# Patient Record
Sex: Female | Born: 1965
Health system: Southern US, Community
[De-identification: ages and names within clinical notes are randomized; demographics above are authoritative.]

## PROBLEM LIST (undated history)

## (undated) DIAGNOSIS — F419 Anxiety disorder, unspecified: Secondary | ICD-10-CM

## (undated) DIAGNOSIS — C4491 Basal cell carcinoma of skin, unspecified: Secondary | ICD-10-CM

## (undated) DIAGNOSIS — M519 Unspecified thoracic, thoracolumbar and lumbosacral intervertebral disc disorder: Secondary | ICD-10-CM

## (undated) DIAGNOSIS — F329 Major depressive disorder, single episode, unspecified: Secondary | ICD-10-CM

## (undated) DIAGNOSIS — IMO0002 Reserved for concepts with insufficient information to code with codable children: Secondary | ICD-10-CM

## (undated) DIAGNOSIS — F32A Depression, unspecified: Secondary | ICD-10-CM

## (undated) HISTORY — PX: COLPOSCOPY: SHX161

## (undated) HISTORY — DX: Basal cell carcinoma of skin, unspecified: C44.91

## (undated) HISTORY — PX: OTHER SURGICAL HISTORY: SHX169

## (undated) HISTORY — DX: Unspecified thoracic, thoracolumbar and lumbosacral intervertebral disc disorder: M51.9

## (undated) HISTORY — DX: Reserved for concepts with insufficient information to code with codable children: IMO0002

---

## 2003-07-17 ENCOUNTER — Encounter: Admission: RE | Admit: 2003-07-17 | Discharge: 2003-07-17 | Payer: Self-pay | Admitting: Family Medicine

## 2003-10-15 ENCOUNTER — Other Ambulatory Visit: Admission: RE | Admit: 2003-10-15 | Discharge: 2003-10-15 | Payer: Self-pay | Admitting: Gynecology

## 2003-12-30 ENCOUNTER — Emergency Department (HOSPITAL_COMMUNITY): Admission: EM | Admit: 2003-12-30 | Discharge: 2003-12-30 | Payer: Self-pay | Admitting: Family Medicine

## 2004-04-14 ENCOUNTER — Ambulatory Visit (HOSPITAL_COMMUNITY): Admission: RE | Admit: 2004-04-14 | Discharge: 2004-04-14 | Payer: Self-pay | Admitting: Gynecology

## 2004-12-24 ENCOUNTER — Other Ambulatory Visit: Admission: RE | Admit: 2004-12-24 | Discharge: 2004-12-24 | Payer: Self-pay | Admitting: Gynecology

## 2005-12-05 ENCOUNTER — Encounter: Admission: RE | Admit: 2005-12-05 | Discharge: 2005-12-05 | Payer: Self-pay | Admitting: Gynecology

## 2006-01-16 ENCOUNTER — Other Ambulatory Visit: Admission: RE | Admit: 2006-01-16 | Discharge: 2006-01-16 | Payer: Self-pay | Admitting: Gynecology

## 2006-05-11 ENCOUNTER — Emergency Department (HOSPITAL_COMMUNITY): Admission: EM | Admit: 2006-05-11 | Discharge: 2006-05-11 | Payer: Self-pay | Admitting: Emergency Medicine

## 2006-06-14 ENCOUNTER — Other Ambulatory Visit: Admission: RE | Admit: 2006-06-14 | Discharge: 2006-06-14 | Payer: Self-pay | Admitting: Gynecology

## 2007-10-23 DIAGNOSIS — IMO0002 Reserved for concepts with insufficient information to code with codable children: Secondary | ICD-10-CM

## 2007-10-23 HISTORY — DX: Reserved for concepts with insufficient information to code with codable children: IMO0002

## 2007-10-26 ENCOUNTER — Ambulatory Visit: Payer: Self-pay | Admitting: Gynecology

## 2007-10-26 ENCOUNTER — Encounter: Payer: Self-pay | Admitting: Gynecology

## 2007-10-26 ENCOUNTER — Other Ambulatory Visit: Admission: RE | Admit: 2007-10-26 | Discharge: 2007-10-26 | Payer: Self-pay | Admitting: Gynecology

## 2007-11-16 ENCOUNTER — Ambulatory Visit: Payer: Self-pay | Admitting: Gynecology

## 2008-12-10 ENCOUNTER — Ambulatory Visit (HOSPITAL_COMMUNITY): Admission: RE | Admit: 2008-12-10 | Discharge: 2008-12-10 | Payer: Self-pay | Admitting: Gynecology

## 2008-12-15 ENCOUNTER — Encounter: Admission: RE | Admit: 2008-12-15 | Discharge: 2008-12-15 | Payer: Self-pay | Admitting: Gynecology

## 2008-12-16 ENCOUNTER — Encounter (INDEPENDENT_AMBULATORY_CARE_PROVIDER_SITE_OTHER): Payer: Self-pay | Admitting: Diagnostic Radiology

## 2008-12-16 ENCOUNTER — Encounter: Admission: RE | Admit: 2008-12-16 | Discharge: 2008-12-16 | Payer: Self-pay | Admitting: Gynecology

## 2009-12-08 ENCOUNTER — Ambulatory Visit: Payer: Self-pay | Admitting: Gynecology

## 2009-12-08 ENCOUNTER — Other Ambulatory Visit: Admission: RE | Admit: 2009-12-08 | Discharge: 2009-12-08 | Payer: Self-pay | Admitting: Gynecology

## 2010-01-18 ENCOUNTER — Ambulatory Visit: Payer: Self-pay | Admitting: Gynecology

## 2010-01-18 ENCOUNTER — Encounter: Admission: RE | Admit: 2010-01-18 | Discharge: 2010-01-18 | Payer: Self-pay | Admitting: Gynecology

## 2010-03-13 ENCOUNTER — Encounter: Payer: Self-pay | Admitting: Gynecology

## 2010-03-14 ENCOUNTER — Encounter: Payer: Self-pay | Admitting: Family Medicine

## 2010-04-20 ENCOUNTER — Ambulatory Visit: Payer: Self-pay | Admitting: Gynecology

## 2010-10-18 ENCOUNTER — Encounter: Payer: Self-pay | Admitting: Cardiology

## 2010-10-19 ENCOUNTER — Encounter: Payer: Self-pay | Admitting: Cardiology

## 2010-10-19 ENCOUNTER — Ambulatory Visit (INDEPENDENT_AMBULATORY_CARE_PROVIDER_SITE_OTHER): Payer: PRIVATE HEALTH INSURANCE | Admitting: Cardiology

## 2010-10-19 VITALS — BP 112/68 | HR 64 | Ht 63.5 in | Wt 161.1 lb

## 2010-10-19 DIAGNOSIS — R072 Precordial pain: Secondary | ICD-10-CM

## 2010-10-19 DIAGNOSIS — R079 Chest pain, unspecified: Secondary | ICD-10-CM

## 2010-10-19 NOTE — Assessment & Plan Note (Signed)
Symptoms atypical. Will arrange exercise treadmill. If negative no further ischemia evaluation. Question GI contribution.

## 2010-10-19 NOTE — Progress Notes (Signed)
HPI: is a 45 year old female with no prior cardiac history for evaluation of chest pain. Over the past 2-3 months she has had intermittent chest pain. The pain is in the left upper chest and also left upper extremity. It lasts up to one day at a time. It can increase with eating. It is not pleuritic or positional and not exertional. No associated nausea, vomiting, diaphoresis or shortness of breath. There is a question as to whether it increased with climbing stairs. No alleviating factors. She otherwise does not have significant dyspnea on exertion, orthopnea, PND, pedal edema, palpitations or syncope. Because of the above were asked to further evaluate.  Current Outpatient Prescriptions  Medication Sig Dispense Refill  . sertraline (ZOLOFT) 100 MG tablet Take 150 mg by mouth daily.          No Known Allergies  No past medical history on file.  No past surgical history on file.  History   Social History  . Marital Status: Divorced    Spouse Name: N/A    Number of Children: 3  . Years of Education: N/A   Occupational History  .      Mother   Social History Main Topics  . Smoking status: Never Smoker   . Smokeless tobacco: Not on file  . Alcohol Use: Yes     occasional  . Drug Use: No  . Sexually Active: Not on file   Other Topics Concern  . Not on file   Social History Narrative  . No narrative on file    Family History  Problem Relation Age of Onset  . Coronary artery disease Other     x8 (uncles, cousins, grandmother).    ROS: no fevers or chills, productive cough, hemoptysis, dysphasia, odynophagia, melena, hematochezia, dysuria, hematuria, rash, seizure activity, orthopnea, PND, pedal edema, claudication. Remaining systems are negative.  Physical Exam: General:  Well developed/well nourished in NAD Skin warm/dry Patient not depressed No peripheral clubbing Back-normal HEENT-normal/normal eyelids Neck supple/normal carotid upstroke bilaterally; no bruits; no  JVD; no thyromegaly chest - CTA/ normal expansion CV - RRR/normal S1 and S2; no murmurs, rubs or gallops;  PMI nondisplaced Abdomen -NT/ND, no HSM, no mass, + bowel sounds, no bruit 2+ femoral pulses, no bruits Ext-no edema, chords, 2+ DP Neuro-grossly nonfocal  ECG 10/08/10 NSR, no ST changes

## 2010-10-19 NOTE — Patient Instructions (Signed)
Your physician has requested that you have an exercise tolerance test. For further information please visit [email or web address]. Please also follow instruction sheet, as given.   

## 2010-10-28 ENCOUNTER — Encounter: Payer: Self-pay | Admitting: Cardiology

## 2010-11-11 ENCOUNTER — Encounter: Payer: PRIVATE HEALTH INSURANCE | Admitting: Physician Assistant

## 2010-11-25 ENCOUNTER — Encounter: Payer: PRIVATE HEALTH INSURANCE | Admitting: Physician Assistant

## 2010-12-09 ENCOUNTER — Ambulatory Visit (INDEPENDENT_AMBULATORY_CARE_PROVIDER_SITE_OTHER): Payer: PRIVATE HEALTH INSURANCE | Admitting: Physician Assistant

## 2010-12-09 DIAGNOSIS — B001 Herpesviral vesicular dermatitis: Secondary | ICD-10-CM

## 2010-12-09 DIAGNOSIS — R079 Chest pain, unspecified: Secondary | ICD-10-CM

## 2010-12-09 MED ORDER — PENCICLOVIR 1 % EX CREA: TOPICAL_CREAM | CUTANEOUS | Status: DC

## 2010-12-09 NOTE — Assessment & Plan Note (Signed)
Patient complaining of cold sore on lip.  Would like Rx sent in if possible.  Will give Denavir 1%, apply q 2 hrs x 4 days.  Follow up with PCP if no better.

## 2010-12-09 NOTE — Progress Notes (Signed)
Exercise Treadmill Test  Pre-Exercise Testing Evaluation Rhythm: normal sinus  Rate: 72   PR:  .14 QRS:  .08  QT:  .41 QTc: .45     Test  Exercise Tolerance Test Ordering MD: Olga Millers, MD  Interpreting MD:  Tereso Newcomer PA-C  Unique Test No: 1  Treadmill:  1  Indication for ETT: chest pain - rule out ischemia  Contraindication to ETT: No   Stress Modality: exercise - treadmill  Cardiac Imaging Performed: non   Protocol: standard Bruce - maximal  Max BP: 151/98  Max MPHR (bpm):  177 85% MPR (bpm):  150  MPHR obtained (bpm): 153 % MPHR obtained: 87  Reached 85% MPHR (min:sec):9:00 Total Exercise Time (min-sec):  9:43  Workload in METS:  13.4 Borg Scale:15  Reason ETT Terminated:  patient's desire to stop    ST Segment Analysis At Rest: normal ST segments - no evidence of significant ST depression With Exercise: no evidence of significant ST depression  Other Information Arrhythmia:  No Angina during ETT:  absent (0) Quality of ETT:  diagnostic  ETT Interpretation:  normal - no evidence of ischemia by ST analysis  Comments: Good exercise tolerance. No chest pain. Normal BP response to exercise. No ST-T changes to suggest ischemia.   Recommendations: Follow up as directed.

## 2010-12-29 ENCOUNTER — Other Ambulatory Visit (HOSPITAL_COMMUNITY): Payer: Self-pay | Admitting: Family Medicine

## 2010-12-29 DIAGNOSIS — Z1231 Encounter for screening mammogram for malignant neoplasm of breast: Secondary | ICD-10-CM

## 2011-02-01 ENCOUNTER — Ambulatory Visit (HOSPITAL_COMMUNITY): Payer: PRIVATE HEALTH INSURANCE

## 2011-02-28 ENCOUNTER — Ambulatory Visit (HOSPITAL_COMMUNITY): Payer: PRIVATE HEALTH INSURANCE

## 2011-02-28 ENCOUNTER — Ambulatory Visit (HOSPITAL_COMMUNITY)
Admission: RE | Admit: 2011-02-28 | Discharge: 2011-02-28 | Disposition: A | Discharge status: Home / Self Care | Payer: PRIVATE HEALTH INSURANCE | Source: Ambulatory Visit | Attending: Family Medicine | Admitting: Family Medicine

## 2011-02-28 DIAGNOSIS — Z1231 Encounter for screening mammogram for malignant neoplasm of breast: Secondary | ICD-10-CM | POA: Insufficient documentation

## 2011-07-25 ENCOUNTER — Ambulatory Visit (HOSPITAL_BASED_OUTPATIENT_CLINIC_OR_DEPARTMENT_OTHER)
Admission: RE | Admit: 2011-07-25 | Discharge: 2011-07-25 | Disposition: A | Discharge status: Home / Self Care | Payer: PRIVATE HEALTH INSURANCE | Source: Ambulatory Visit | Attending: Family Medicine | Admitting: Family Medicine

## 2011-07-25 ENCOUNTER — Other Ambulatory Visit (HOSPITAL_BASED_OUTPATIENT_CLINIC_OR_DEPARTMENT_OTHER): Payer: Self-pay | Admitting: Family Medicine

## 2011-07-25 DIAGNOSIS — R05 Cough: Secondary | ICD-10-CM

## 2011-07-25 DIAGNOSIS — R059 Cough, unspecified: Secondary | ICD-10-CM

## 2011-07-25 DIAGNOSIS — J329 Chronic sinusitis, unspecified: Secondary | ICD-10-CM | POA: Insufficient documentation

## 2011-09-14 ENCOUNTER — Encounter: Payer: Self-pay | Admitting: Gynecology

## 2011-09-14 DIAGNOSIS — IMO0002 Reserved for concepts with insufficient information to code with codable children: Secondary | ICD-10-CM | POA: Insufficient documentation

## 2011-09-15 ENCOUNTER — Encounter: Payer: Self-pay | Admitting: Gynecology

## 2011-09-15 ENCOUNTER — Ambulatory Visit (INDEPENDENT_AMBULATORY_CARE_PROVIDER_SITE_OTHER): Payer: PRIVATE HEALTH INSURANCE | Admitting: Gynecology

## 2011-09-15 ENCOUNTER — Telehealth: Payer: Self-pay | Admitting: *Deleted

## 2011-09-15 DIAGNOSIS — N63 Unspecified lump in unspecified breast: Secondary | ICD-10-CM

## 2011-09-15 NOTE — Telephone Encounter (Signed)
Message copied by Aura Camps on Thu Sep 15, 2011 12:33 PM ------      Message from: Mckinley Jewel, AMY L      Created: Thu Sep 15, 2011 12:17 PM                   ----- Message -----         From: Dara Lords, MD         Sent: 09/15/2011  12:05 PM           To: Amy Duwaine Maxin, CNA            Schedule right breast ultrasound at breast center reference small nodule 4:00 position under areola. Recent negative mammography January 2013

## 2011-09-15 NOTE — Patient Instructions (Signed)
Office will call you to schedule breast ultrasound.

## 2011-09-15 NOTE — Telephone Encounter (Signed)
Order place for Diag. Mammo right breast and ultrasound per breast center, they will call pt for appointment.

## 2011-09-15 NOTE — Progress Notes (Signed)
Patient ID: Katrina Huff, female   DOB: 10-19-1965, 46 y.o.   MRN: [id number] Patient presents having felt a new lump in her right breast of the last 2 weeks. Is not tender but noticed on SBE. Had mammogram in January which was normal. No nipple discharge or other symptoms.  Exam with Kim Asst. Breasts examined lying and sitting: Left without masses retractions discharge adenopathy. Right with small nodule 4:00 border of the areola no overlying skin changes nipple discharge or axillary adenopathy.  Physical Exam  Pulmonary/Chest:     Assessment and plan: Small nodule right breast 4:00 position under areola. Suspect normal breast lobule rule out mass/cyst. Recommend to start with ultrasound. Recent mammogram was normal. Will allow radiologist to determine if diagnostic mammography necessary.  Office will arrange and patient knows to expect a phone call.

## 2011-09-19 NOTE — Telephone Encounter (Signed)
Breast center appointment at 09/28/11 @ 1:00 pm

## 2011-09-28 ENCOUNTER — Ambulatory Visit
Admission: RE | Admit: 2011-09-28 | Discharge: 2011-09-28 | Disposition: A | Discharge status: Home / Self Care | Payer: PRIVATE HEALTH INSURANCE | Source: Ambulatory Visit | Attending: Gynecology | Admitting: Gynecology

## 2011-09-28 DIAGNOSIS — N63 Unspecified lump in unspecified breast: Secondary | ICD-10-CM

## 2011-10-06 ENCOUNTER — Other Ambulatory Visit: Payer: Self-pay | Admitting: Orthopaedic Surgery

## 2011-10-06 DIAGNOSIS — M545 Low back pain, unspecified: Secondary | ICD-10-CM

## 2011-10-07 ENCOUNTER — Ambulatory Visit
Admission: RE | Admit: 2011-10-07 | Discharge: 2011-10-07 | Disposition: A | Discharge status: Home / Self Care | Payer: PRIVATE HEALTH INSURANCE | Source: Ambulatory Visit | Attending: Orthopaedic Surgery | Admitting: Orthopaedic Surgery

## 2011-10-07 DIAGNOSIS — M545 Low back pain, unspecified: Secondary | ICD-10-CM

## 2012-02-07 ENCOUNTER — Other Ambulatory Visit: Payer: Self-pay | Admitting: Gynecology

## 2012-02-07 DIAGNOSIS — Z1231 Encounter for screening mammogram for malignant neoplasm of breast: Secondary | ICD-10-CM

## 2012-03-01 ENCOUNTER — Other Ambulatory Visit (HOSPITAL_COMMUNITY)
Admission: RE | Admit: 2012-03-01 | Discharge: 2012-03-01 | Disposition: A | Discharge status: Home / Self Care | Payer: BC Managed Care – PPO | Source: Ambulatory Visit | Attending: Women's Health | Admitting: Women's Health

## 2012-03-01 ENCOUNTER — Encounter: Payer: Self-pay | Admitting: Women's Health

## 2012-03-01 ENCOUNTER — Ambulatory Visit (INDEPENDENT_AMBULATORY_CARE_PROVIDER_SITE_OTHER): Payer: BC Managed Care – PPO | Admitting: Women's Health

## 2012-03-01 ENCOUNTER — Encounter: Payer: Self-pay | Admitting: Gynecology

## 2012-03-01 ENCOUNTER — Ambulatory Visit (HOSPITAL_COMMUNITY)
Admission: RE | Admit: 2012-03-01 | Discharge: 2012-03-01 | Disposition: A | Discharge status: Home / Self Care | Payer: PRIVATE HEALTH INSURANCE | Source: Ambulatory Visit | Attending: Gynecology | Admitting: Gynecology

## 2012-03-01 VITALS — BP 120/72 | Ht 64.0 in | Wt 172.0 lb

## 2012-03-01 DIAGNOSIS — Z01419 Encounter for gynecological examination (general) (routine) without abnormal findings: Secondary | ICD-10-CM

## 2012-03-01 DIAGNOSIS — N912 Amenorrhea, unspecified: Secondary | ICD-10-CM

## 2012-03-01 DIAGNOSIS — Z833 Family history of diabetes mellitus: Secondary | ICD-10-CM

## 2012-03-01 DIAGNOSIS — Z1151 Encounter for screening for human papillomavirus (HPV): Secondary | ICD-10-CM | POA: Insufficient documentation

## 2012-03-01 DIAGNOSIS — Z13220 Encounter for screening for lipoid disorders: Secondary | ICD-10-CM

## 2012-03-01 DIAGNOSIS — Z23 Encounter for immunization: Secondary | ICD-10-CM

## 2012-03-01 DIAGNOSIS — F419 Anxiety disorder, unspecified: Secondary | ICD-10-CM | POA: Insufficient documentation

## 2012-03-01 DIAGNOSIS — Z1231 Encounter for screening mammogram for malignant neoplasm of breast: Secondary | ICD-10-CM | POA: Insufficient documentation

## 2012-03-01 DIAGNOSIS — M519 Unspecified thoracic, thoracolumbar and lumbosacral intervertebral disc disorder: Secondary | ICD-10-CM | POA: Insufficient documentation

## 2012-03-01 DIAGNOSIS — F411 Generalized anxiety disorder: Secondary | ICD-10-CM

## 2012-03-01 LAB — CBC WITH DIFFERENTIAL/PLATELET
Eosinophils Relative: 2 % (ref 0–5)
HCT: 40.5 % (ref 36.0–46.0)
Lymphocytes Relative: 20 % (ref 12–46)
Lymphs Abs: 1.7 10*3/uL (ref 0.7–4.0)
MCV: 89.8 fL (ref 78.0–100.0)
Platelets: 365 10*3/uL (ref 150–400)
RBC: 4.51 MIL/uL (ref 3.87–5.11)
WBC: 8.7 10*3/uL (ref 4.0–10.5)

## 2012-03-01 LAB — LIPID PANEL
Cholesterol: 227 mg/dL — ABNORMAL HIGH (ref 0–200)
Triglycerides: 89 mg/dL (ref <150)
VLDL: 18 mg/dL (ref 0–40)

## 2012-03-01 MED ORDER — SERTRALINE HCL 100 MG PO TABS: 150.0000 mg | ORAL_TABLET | Freq: Every day | ORAL | Status: DC

## 2012-03-01 MED ORDER — ALPRAZOLAM 0.25 MG PO TABS: 0.2500 mg | ORAL_TABLET | Freq: Every evening | ORAL | Status: DC | PRN

## 2012-03-01 NOTE — Progress Notes (Signed)
[facility] May 07, 1965 [id number]    History:    The patient presents for annual exam.  Amenorrheic  Mirena IUD placed 12/2009 by Dr. Audie Box. History of LGSIL/CIN-1 with biopsy confirming in 2009. Only Pap since - 2011 - normal. History of benign breast biopsies for cysts. Mammogram today. Having some problems with anxiety, situational stressors, mother 89 diagnosed with breast cancer last week. Currently on Zoloft 150 mg daily.  Past medical history, past surgical history, family history and social history were all reviewed and documented in the EPIC chart. Single mother, works part time, Katrina Huff 14, Katrina Huff 11, Katrina Huff 9 all doing well.   ROS:  A  ROS was performed and pertinent positives and negatives are included in the history.  Exam:  Filed Vitals:   03/01/12 1137  BP: 120/72    General appearance:  Normal Head/Neck:  Normal, without cervical or supraclavicular adenopathy. Thyroid:  Symmetrical, normal in size, without palpable masses or nodularity. Respiratory  Effort:  Normal  Auscultation:  Clear without wheezing or rhonchi Cardiovascular  Auscultation:  Regular rate, without rubs, murmurs or gallops  Edema/varicosities:  Not grossly evident Abdominal  Soft,nontender, without masses, guarding or rebound.  Liver/spleen:  No organomegaly noted  Hernia:  None appreciated  Skin  Inspection:  Grossly normal  Palpation:  Grossly normal Neurologic/psychiatric  Orientation:  Normal with appropriate conversation.  Mood/affect:  Normal  Genitourinary    Breasts: Examined lying and sitting.     Right: Without masses, retractions, discharge or axillary adenopathy.     Left: Without masses, retractions, discharge or axillary adenopathy.   Inguinal/mons:  Normal without inguinal adenopathy  External genitalia:  Normal  BUS/Urethra/Skene's glands:  Normal  Bladder:  Normal  Vagina:  Normal  Cervix:  Normal IUD strings visible  Uterus:   normal in size, shape and  contour.  Midline and mobile  Adnexa/parametria:     Rt: Without masses or tenderness.   Lt: Without masses or tenderness.  Anus and perineum: Normal  Digital rectal exam: Normal sphincter tone without palpated masses or tenderness  Assessment/Plan:  47 y.o. D. WF G3 P3 for annual exam.    Mirena IUD  12/2009 -  Amenorrheic Anxiety-situational stressors Zoloft 150 History of LGSIL/CIN-1 2009, normal Pap 2011  Plan: Pap with HPV typing, reviewed importance of  followup after abnormal Paps. SBE's, continue annual screening, report changes, continue regular exercise, decrease calories for weight loss, calcium rich diet, vitamin D 1000 daily encouraged. CBC, glucose, lipid panel, TSH, FSH, UA. Zoloft 150 mg by mouth daily, continue counseling as needed, Xanax 0.25 prescription, and reviewed importance of using sparingly. BRCA testing reviewed, will ask mother to have done.   [name] J WHNP, 1:10 PM 03/01/2012

## 2012-03-01 NOTE — Patient Instructions (Signed)
BRCA testing for mom  Health Maintenance, Females A healthy lifestyle and preventative care can promote health and wellness.  Maintain regular health, dental, and eye exams.  Eat a healthy diet. Foods like vegetables, fruits, whole grains, low-fat dairy products, and lean protein foods contain the nutrients you need without too many calories. Decrease your intake of foods high in solid fats, added sugars, and salt. Get information about a proper diet from your caregiver, if necessary.  Regular physical exercise is one of the most important things you can do for your health. Most adults should get at least 150 minutes of moderate-intensity exercise (any activity that increases your heart rate and causes you to sweat) each week. In addition, most adults need muscle-strengthening exercises on 2 or more days a week.   Maintain a healthy weight. The body mass index (BMI) is a screening tool to identify possible weight problems. It provides an estimate of body fat based on height and weight. Your caregiver can help determine your BMI, and can help you achieve or maintain a healthy weight. For adults 20 years and older:  A BMI below 18.5 is considered underweight.  A BMI of 18.5 to 24.9 is normal.  A BMI of 25 to 29.9 is considered overweight.  A BMI of 30 and above is considered obese.  Maintain normal blood lipids and cholesterol by exercising and minimizing your intake of saturated fat. Eat a balanced diet with plenty of fruits and vegetables. Blood tests for lipids and cholesterol should begin at age 108 and be repeated every 5 years. If your lipid or cholesterol levels are high, you are over 50, or you are a high risk for heart disease, you may need your cholesterol levels checked more frequently.Ongoing high lipid and cholesterol levels should be treated with medicines if diet and exercise are not effective.  If you smoke, find out from your caregiver how to quit. If you do not use tobacco, do  not start.  If you are pregnant, do not drink alcohol. If you are breastfeeding, be very cautious about drinking alcohol. If you are not pregnant and choose to drink alcohol, do not exceed 1 drink per day. One drink is considered to be 12 ounces (355 mL) of beer, 5 ounces (148 mL) of wine, or 1.5 ounces (44 mL) of liquor.  Avoid use of street drugs. Do not share needles with anyone. Ask for help if you need support or instructions about stopping the use of drugs.  High blood pressure causes heart disease and increases the risk of stroke. Blood pressure should be checked at least every 1 to 2 years. Ongoing high blood pressure should be treated with medicines, if weight loss and exercise are not effective.  If you are 42 to 46 years old, ask your caregiver if you should take aspirin to prevent strokes.  Diabetes screening involves taking a blood sample to check your fasting blood sugar level. This should be done once every 3 years, after age 81, if you are within normal weight and without risk factors for diabetes. Testing should be considered at a younger age or be carried out more frequently if you are overweight and have at least 1 risk factor for diabetes.  Breast cancer screening is essential preventative care for women. You should practice "breast self-awareness." This means understanding the normal appearance and feel of your breasts and may include breast self-examination. Any changes detected, no matter how small, should be reported to a caregiver. Women in their  20s and 30s should have a clinical breast exam (CBE) by a caregiver as part of a regular health exam every 1 to 3 years. After age 69, women should have a CBE every year. Starting at age 23, women should consider having a mammogram (breast X-ray) every year. Women who have a family history of breast cancer should talk to their caregiver about genetic screening. Women at a high risk of breast cancer should talk to their caregiver about  having an MRI and a mammogram every year.  The Pap test is a screening test for cervical cancer. Women should have a Pap test starting at age 9. Between ages 77 and 70, Pap tests should be repeated every 2 years. Beginning at age 37, you should have a Pap test every 3 years as long as the past 3 Pap tests have been normal. If you had a hysterectomy for a problem that was not cancer or a condition that could lead to cancer, then you no longer need Pap tests. If you are between ages 58 and 72, and you have had normal Pap tests going back 10 years, you no longer need Pap tests. If you have had past treatment for cervical cancer or a condition that could lead to cancer, you need Pap tests and screening for cancer for at least 20 years after your treatment. If Pap tests have been discontinued, risk factors (such as a new sexual partner) need to be reassessed to determine if screening should be resumed. Some women have medical problems that increase the chance of getting cervical cancer. In these cases, your caregiver may recommend more frequent screening and Pap tests.  The human papillomavirus (HPV) test is an additional test that may be used for cervical cancer screening. The HPV test looks for the virus that can cause the cell changes on the cervix. The cells collected during the Pap test can be tested for HPV. The HPV test could be used to screen women aged 69 years and older, and should be used in women of any age who have unclear Pap test results. After the age of 89, women should have HPV testing at the same frequency as a Pap test.  Colorectal cancer can be detected and often prevented. Most routine colorectal cancer screening begins at the age of 70 and continues through age 43. However, your caregiver may recommend screening at an earlier age if you have risk factors for colon cancer. On a yearly basis, your caregiver may provide home test kits to check for hidden blood in the stool. Use of a small  camera at the end of a tube, to directly examine the colon (sigmoidoscopy or colonoscopy), can detect the earliest forms of colorectal cancer. Talk to your caregiver about this at age 43, when routine screening begins. Direct examination of the colon should be repeated every 5 to 10 years through age 40, unless early forms of pre-cancerous polyps or small growths are found.  Hepatitis C blood testing is recommended for all people born from [birth year] through 1964 and any individual with known risks for hepatitis C.  Practice safe sex. Use condoms and avoid high-risk sexual practices to reduce the spread of sexually transmitted infections (STIs). Sexually active women aged 39 and younger should be checked for Chlamydia, which is a common sexually transmitted infection. Older women with new or multiple partners should also be tested for Chlamydia. Testing for other STIs is recommended if you are sexually active and at increased risk.  Osteoporosis is  a disease in which the bones lose minerals and strength with aging. This can result in serious bone fractures. The risk of osteoporosis can be identified using a bone density scan. Women ages 8 and over and women at risk for fractures or osteoporosis should discuss screening with their caregivers. Ask your caregiver whether you should be taking a calcium supplement or vitamin D to reduce the rate of osteoporosis.  Menopause can be associated with physical symptoms and risks. Hormone replacement therapy is available to decrease symptoms and risks. You should talk to your caregiver about whether hormone replacement therapy is right for you.  Use sunscreen with a sun protection factor (SPF) of 30 or greater. Apply sunscreen liberally and repeatedly throughout the day. You should seek shade when your shadow is shorter than you. Protect yourself by wearing long sleeves, pants, a wide-brimmed hat, and sunglasses year round, whenever you are outdoors.  Notify your  caregiver of new moles or changes in moles, especially if there is a change in shape or color. Also notify your caregiver if a mole is larger than the size of a pencil eraser.  Stay current with your immunizations. Document Released: 08/23/2010 Document Revised: 05/02/2011 Document Reviewed: 08/23/2010 [facility] Patient Information 2013 [place], [place].

## 2012-03-02 ENCOUNTER — Other Ambulatory Visit: Payer: Self-pay | Admitting: Women's Health

## 2012-03-02 DIAGNOSIS — Z13220 Encounter for screening for lipoid disorders: Secondary | ICD-10-CM

## 2012-03-02 LAB — URINALYSIS W MICROSCOPIC + REFLEX CULTURE
Hgb urine dipstick: NEGATIVE
Leukocytes, UA: NEGATIVE
Nitrite: NEGATIVE
Protein, ur: NEGATIVE mg/dL
Urobilinogen, UA: 0.2 mg/dL (ref 0.0–1.0)

## 2012-03-12 ENCOUNTER — Telehealth: Payer: Self-pay | Admitting: *Deleted

## 2012-03-12 ENCOUNTER — Ambulatory Visit (INDEPENDENT_AMBULATORY_CARE_PROVIDER_SITE_OTHER): Payer: BC Managed Care – PPO | Admitting: Gynecology

## 2012-03-12 ENCOUNTER — Encounter: Payer: Self-pay | Admitting: Gynecology

## 2012-03-12 ENCOUNTER — Encounter: Payer: Self-pay | Admitting: Women's Health

## 2012-03-12 DIAGNOSIS — N643 Galactorrhea not associated with childbirth: Secondary | ICD-10-CM

## 2012-03-12 NOTE — Patient Instructions (Addendum)
Office will contact you with prolactin bloodwork.

## 2012-03-12 NOTE — Progress Notes (Signed)
Patient presents with several weeks of clear milky discharge right nipple. Preceded by a feeling of milk let down. No history of this before. Never bloody.  Just had mammogram which was normal. Prior history of small nodule 4:00 position right areola July 2013 which has resolved by herself exam. She had an ultrasound which was negative.  Both breast examined lying and sitting with teenage daughter present. Left without masses retractions discharge adenopathy. Right with clear to milky galactorrhea otherwise without masses retractions discharge adenopathy.  Assessment and plan: New-onset galactorrhea, did have 2 steroid injections in her back recently.  Recent TSH and mammogram negative. Recommend checking baseline prolactin. As long as it is normal we'll plan expectant management. If it is ever bloody she knows to call for further evaluation. If prolactin abnormal discussed pituitary adenoma workup.

## 2012-03-12 NOTE — Telephone Encounter (Signed)
Pt coming in for appointment today at 4:00 pm

## 2012-03-12 NOTE — Telephone Encounter (Signed)
Pt called c/o right breast discharge on Saturday night she had mammogram done on 03/01/12 along with annual done same day. She took pregnancy test and it was negative. No pain said her breast did feel funny. She asked if you would call at [phone].  She spoke with Dr.Lathrop on call this weekend and was told to follow up with you.

## 2012-03-29 ENCOUNTER — Other Ambulatory Visit: Payer: Self-pay | Admitting: *Deleted

## 2012-03-29 DIAGNOSIS — F419 Anxiety disorder, unspecified: Secondary | ICD-10-CM

## 2012-03-29 MED ORDER — SERTRALINE HCL 100 MG PO TABS: 150.0000 mg | ORAL_TABLET | Freq: Every day | ORAL | Status: DC

## 2012-03-29 NOTE — Telephone Encounter (Signed)
Pt needed a 90 day rx

## 2012-05-23 ENCOUNTER — Other Ambulatory Visit: Payer: Self-pay | Admitting: Neurological Surgery

## 2012-05-23 DIAGNOSIS — IMO0002 Reserved for concepts with insufficient information to code with codable children: Secondary | ICD-10-CM

## 2012-05-29 ENCOUNTER — Other Ambulatory Visit: Payer: BC Managed Care – PPO

## 2012-07-28 ENCOUNTER — Other Ambulatory Visit: Payer: Self-pay | Admitting: Women's Health

## 2012-07-31 NOTE — Telephone Encounter (Signed)
rx called in [facility]

## 2012-08-22 ENCOUNTER — Other Ambulatory Visit: Payer: Self-pay | Admitting: *Deleted

## 2012-08-22 DIAGNOSIS — E559 Vitamin D deficiency, unspecified: Secondary | ICD-10-CM

## 2012-08-22 DIAGNOSIS — Z0000 Encounter for general adult medical examination without abnormal findings: Secondary | ICD-10-CM

## 2012-08-22 DIAGNOSIS — E785 Hyperlipidemia, unspecified: Secondary | ICD-10-CM

## 2012-08-23 ENCOUNTER — Other Ambulatory Visit: Payer: Self-pay | Admitting: Family Medicine

## 2012-08-23 ENCOUNTER — Ambulatory Visit: Payer: BC Managed Care – PPO | Admitting: Family Medicine

## 2012-08-23 ENCOUNTER — Other Ambulatory Visit: Payer: BC Managed Care – PPO | Admitting: *Deleted

## 2012-08-23 DIAGNOSIS — E785 Hyperlipidemia, unspecified: Secondary | ICD-10-CM

## 2012-08-23 DIAGNOSIS — Z0000 Encounter for general adult medical examination without abnormal findings: Secondary | ICD-10-CM

## 2012-08-23 DIAGNOSIS — IMO0002 Reserved for concepts with insufficient information to code with codable children: Secondary | ICD-10-CM

## 2012-08-23 DIAGNOSIS — Z13220 Encounter for screening for lipoid disorders: Secondary | ICD-10-CM

## 2012-08-23 DIAGNOSIS — E559 Vitamin D deficiency, unspecified: Secondary | ICD-10-CM

## 2012-08-23 LAB — CBC WITH DIFFERENTIAL/PLATELET
Basophils Absolute: 0 10*3/uL (ref 0.0–0.1)
Basophils Relative: 0 % (ref 0–1)
Eosinophils Absolute: 0.2 10*3/uL (ref 0.0–0.7)
Eosinophils Relative: 1 % (ref 0–5)
HCT: 39.6 % (ref 36.0–46.0)
Hemoglobin: 13.6 g/dL (ref 12.0–15.0)
Lymphocytes Relative: 15 % (ref 12–46)
Lymphs Abs: 2.2 10*3/uL (ref 0.7–4.0)
MCH: 29.9 pg (ref 26.0–34.0)
MCHC: 34.3 g/dL (ref 30.0–36.0)
MCV: 87 fL (ref 78.0–100.0)
Monocytes Absolute: 1.1 10*3/uL — ABNORMAL HIGH (ref 0.1–1.0)
Monocytes Relative: 8 % (ref 3–12)
Neutro Abs: 10.8 10*3/uL — ABNORMAL HIGH (ref 1.7–7.7)
Neutrophils Relative %: 76 % (ref 43–77)
Platelets: 409 10*3/uL — ABNORMAL HIGH (ref 150–400)
RBC: 4.55 MIL/uL (ref 3.87–5.11)
RDW: 13.4 % (ref 11.5–15.5)
WBC: 14.2 10*3/uL — ABNORMAL HIGH (ref 4.0–10.5)

## 2012-08-23 LAB — TSH: TSH: 0.93 u[IU]/mL (ref 0.350–4.500)

## 2012-08-24 LAB — COMPLETE METABOLIC PANEL WITH GFR
ALT: 16 U/L (ref 0–35)
AST: 17 U/L (ref 0–37)
Albumin: 4.4 g/dL (ref 3.5–5.2)
Alkaline Phosphatase: 67 U/L (ref 39–117)
BUN: 18 mg/dL (ref 6–23)
CO2: 26 mEq/L (ref 19–32)
Calcium: 9.4 mg/dL (ref 8.4–10.5)
Chloride: 100 mEq/L (ref 96–112)
Creat: 0.88 mg/dL (ref 0.50–1.10)
GFR, Est African American: >89 mL/min
GFR, Est Non African American: 80 mL/min
Glucose, Bld: 91 mg/dL (ref 70–99)
Potassium: 4.2 mEq/L (ref 3.5–5.3)
Sodium: 134 mEq/L — ABNORMAL LOW (ref 135–145)
Total Bilirubin: 0.7 mg/dL (ref 0.3–1.2)
Total Protein: 7.2 g/dL (ref 6.0–8.3)

## 2012-08-24 LAB — VITAMIN D 25 HYDROXY (VIT D DEFICIENCY, FRACTURES): Vit D, 25-Hydroxy: 20 ng/mL — ABNORMAL LOW (ref 30–89)

## 2012-08-27 ENCOUNTER — Telehealth: Payer: Self-pay | Admitting: Family Medicine

## 2012-08-27 ENCOUNTER — Ambulatory Visit (INDEPENDENT_AMBULATORY_CARE_PROVIDER_SITE_OTHER): Payer: BC Managed Care – PPO | Admitting: Family Medicine

## 2012-08-27 ENCOUNTER — Encounter: Payer: Self-pay | Admitting: Family Medicine

## 2012-08-27 VITALS — BP 113/77 | HR 76 | Wt 192.0 lb

## 2012-08-27 DIAGNOSIS — E559 Vitamin D deficiency, unspecified: Secondary | ICD-10-CM

## 2012-08-27 DIAGNOSIS — E669 Obesity, unspecified: Secondary | ICD-10-CM

## 2012-08-27 DIAGNOSIS — D72829 Elevated white blood cell count, unspecified: Secondary | ICD-10-CM

## 2012-08-27 DIAGNOSIS — R21 Rash and other nonspecific skin eruption: Secondary | ICD-10-CM

## 2012-08-27 DIAGNOSIS — E785 Hyperlipidemia, unspecified: Secondary | ICD-10-CM

## 2012-08-27 LAB — LIPID PANEL
Cholesterol: 198 mg/dL (ref 0–200)
HDL: 62 mg/dL (ref >39)
LDL Cholesterol: 119 mg/dL — ABNORMAL HIGH (ref 0–99)
Total CHOL/HDL Ratio: 3.2 Ratio
Triglycerides: 86 mg/dL (ref <150)
VLDL: 17 mg/dL (ref 0–40)

## 2012-08-27 MED ORDER — PHENTERMINE-TOPIRAMATE ER 7.5-46 MG PO CP24: 1.0000 | ORAL_CAPSULE | Freq: Every day | ORAL | Status: DC

## 2012-08-27 MED ORDER — CLOBETASOL PROPIONATE 0.05 % EX FOAM: Freq: Two times a day (BID) | CUTANEOUS | Status: DC

## 2012-08-27 MED ORDER — PHENTERMINE-TOPIRAMATE ER 3.75-23 MG PO CP24: 1.0000 | ORAL_CAPSULE | Freq: Every day | ORAL | Status: DC

## 2012-08-27 MED ORDER — AZITHROMYCIN 500 MG PO TABS: 500.0000 mg | ORAL_TABLET | Freq: Every day | ORAL | Status: DC

## 2012-08-27 NOTE — Progress Notes (Signed)
  Subjective:    Patient ID: Katrina Huff, female    DOB: 1966/01/19, 47 y.o.   MRN: [id number]  HPI  Katrina Huff is here today to go over her most recent lab results, get medication refills and discuss the issues listed below.  She has done well since her last office visit.  Her only complaint today is some mild URI symptoms.    1)  Hyperlipidemia:  She has been working on limiting her intake of fatty foods.    2)  Anxiety:  Her symptoms are controlled on Zoloft.    3)  ADHD:  She is no longer taking Vyvanse.    4) Weight:  She continues to struggle with her weight.    5)  SOB:  She has had some mild URI symptoms and has felt SOB.     Review of Systems  Constitutional: Positive for appetite change, fatigue and unexpected weight change. Negative for fever.  HENT: Positive for congestion.   Respiratory: Positive for cough.   Cardiovascular: Negative for chest pain, palpitations and leg swelling.  Endocrine: Negative.   Genitourinary: Negative.   Psychiatric/Behavioral: Positive for decreased concentration. Negative for dysphoric mood. The patient is not nervous/anxious.     Past Medical History  Diagnosis Date  . LGSIL (low grade squamous intraepithelial dysplasia) 10/2007  . Disc disorder     L4,L5   Family History  Problem Relation Age of Onset  . Coronary artery disease Other     x8 (uncles, cousins, grandmother).  . Diabetes Father   . Cancer Father     Carcinoid  . Breast cancer Mother     Age 55  . Breast cancer Maternal Grandmother     Onset postmenopausal  . Coronary artery disease Maternal Grandmother    Past Medical History  Diagnosis Date  . LGSIL (low grade squamous intraepithelial dysplasia) 10/2007  . Disc disorder     L4,L5        Objective:   Physical Exam  Constitutional: She appears well-nourished. No distress.  HENT:  Head: Normocephalic.  Eyes: No scleral icterus.  Neck: No thyromegaly present.  Cardiovascular: Normal rate, regular rhythm  and normal heart sounds.   Pulmonary/Chest: Effort normal and breath sounds normal.  Abdominal: There is no tenderness.  Musculoskeletal: She exhibits no edema and no tenderness.  Neurological: She is alert.  Skin: Skin is warm and dry. Rash noted.  Psychiatric: She has a normal mood and affect. Her behavior is normal. Judgment and thought content normal.          Assessment & Plan:

## 2012-08-27 NOTE — Patient Instructions (Addendum)
1)  Elevated WBC count - Take Zithromax daily for 3 days.  We'll recheck your WBC count in 2 weeks.  If you continue to feel short of breath, we'll check a chest x-ray.     Leukocytosis Leukocytosis means you have more white blood cells than normal. White blood cells are made in your bone marrow. The main job of white blood cells is to fight infection. Having too many white blood cells is a common condition. It can develop as a result of many types of medical problems. CAUSES  In some cases, your bone marrow may be normal, but it is still making too many white blood cells. This could be the result of:  Infection.  Injury.  Physical stress.  Emotional stress.  Surgery.  Allergic reactions.  Tumors that do not start in the blood or bone marrow.  An inherited disease.  Certain medicines.  Pregnancy and labor. In other cases, you may have a bone marrow disorder that is causing your body to make too many white blood cells. Bone marrow disorders include:  Leukemia. This is a type of blood cancer.  Myeloproliferative disorders. These disorders cause blood cells to grow abnormally. SYMPTOMS  Some people have no symptoms. Others have symptoms due to the medical problem that is causing their leukocytosis. These symptoms may include:  Bleeding.  Bruising.  Fever.  Night sweats.  Repeated infections.  Weakness.  Weight loss. DIAGNOSIS  Leukocytosis is often found during blood tests that are done as part of a normal physical exam. Your caregiver will probably order other tests to help determine why you have too many white blood cells. These tests may include:  A complete blood count (CBC). This test measures all the types of blood cells in your body.  Chest X-rays, urine tests (urinalysis), or other tests to look for signs of infection.  Bone marrow aspiration. For this test, a needle is put into your bone. Cells from the bone marrow are removed through the needle. The cells  are then examined under a microscope. TREATMENT  Treatment is usually not needed for leukocytosis. However, if a disorder is causing your leukocytosis, it will need to be treated. Treatment may include:  Antibiotic medicines if you have a bacterial infection.  Bone marrow transplant. Your diseased bone marrow is replaced with healthy cells that will grow new bone marrow.  Chemotherapy. This is the use of drugs to kill cancer cells. HOME CARE INSTRUCTIONS  Only take over-the-counter or prescription medicines as directed by your caregiver.  Maintain a healthy weight. Ask your caregiver what weight is best for you.  Eat foods that are low in saturated fats and high in fiber. Eat plenty of fruits and vegetables.  Drink enough fluids to keep your urine clear or pale yellow.  Get 30 minutes of exercise at least 5 times a week. Check with your caregiver before starting a new exercise routine.  Limit caffeine and alcohol.  Do not smoke.  Keep all follow-up appointments as directed by your caregiver. SEEK MEDICAL CARE IF:  You feel weak or more tired than usual.  You develop chills, a cough, or nasal congestion.  You lose weight without trying.  You have night sweats.  You bruise easily. SEEK IMMEDIATE MEDICAL CARE IF:  You bleed more than normal.  You have chest pain.  You have trouble breathing.  You have a fever.  You have uncontrolled nausea or vomiting.  You feel dizzy or lightheaded. MAKE SURE YOU:  Understand these  instructions.  Will watch your condition.  Will get help right away if you are not doing well or get worse. Document Released: 01/27/2011 Document Revised: 05/02/2011 Document Reviewed: 01/27/2011 [facility] Patient Information 2014 [place], [place]. Leukocytosis Leukocytosis means you have more white blood cells than normal. White blood cells are made in your bone marrow. The main job of white blood cells is to fight infection. Having too many white  blood cells is a common condition. It can develop as a result of many types of medical problems. CAUSES  In some cases, your bone marrow may be normal, but it is still making too many white blood cells. This could be the result of:  Infection.  Injury.  Physical stress.  Emotional stress.  Surgery.  Allergic reactions.  Tumors that do not start in the blood or bone marrow.  An inherited disease.  Certain medicines.  Pregnancy and labor. In other cases, you may have a bone marrow disorder that is causing your body to make too many white blood cells. Bone marrow disorders include:  Leukemia. This is a type of blood cancer.  Myeloproliferative disorders. These disorders cause blood cells to grow abnormally. SYMPTOMS  Some people have no symptoms. Others have symptoms due to the medical problem that is causing their leukocytosis. These symptoms may include:  Bleeding.  Bruising.  Fever.  Night sweats.  Repeated infections.  Weakness.  Weight loss. DIAGNOSIS  Leukocytosis is often found during blood tests that are done as part of a normal physical exam. Your caregiver will probably order other tests to help determine why you have too many white blood cells. These tests may include:  A complete blood count (CBC). This test measures all the types of blood cells in your body.  Chest X-rays, urine tests (urinalysis), or other tests to look for signs of infection.  Bone marrow aspiration. For this test, a needle is put into your bone. Cells from the bone marrow are removed through the needle. The cells are then examined under a microscope. TREATMENT  Treatment is usually not needed for leukocytosis. However, if a disorder is causing your leukocytosis, it will need to be treated. Treatment may include:  Antibiotic medicines if you have a bacterial infection.  Bone marrow transplant. Your diseased bone marrow is replaced with healthy cells that will grow new bone  marrow.  Chemotherapy. This is the use of drugs to kill cancer cells. HOME CARE INSTRUCTIONS  Only take over-the-counter or prescription medicines as directed by your caregiver.  Maintain a healthy weight. Ask your caregiver what weight is best for you.  Eat foods that are low in saturated fats and high in fiber. Eat plenty of fruits and vegetables.  Drink enough fluids to keep your urine clear or pale yellow.  Get 30 minutes of exercise at least 5 times a week. Check with your caregiver before starting a new exercise routine.  Limit caffeine and alcohol.  Do not smoke.  Keep all follow-up appointments as directed by your caregiver. SEEK MEDICAL CARE IF:  You feel weak or more tired than usual.  You develop chills, a cough, or nasal congestion.  You lose weight without trying.  You have night sweats.  You bruise easily. SEEK IMMEDIATE MEDICAL CARE IF:  You bleed more than normal.  You have chest pain.  You have trouble breathing.  You have a fever.  You have uncontrolled nausea or vomiting.  You feel dizzy or lightheaded. MAKE SURE YOU:  Understand these instructions.  Will watch your condition.  Will get help right away if you are not doing well or get worse. Document Released: 01/27/2011 Document Revised: 05/02/2011 Document Reviewed: 01/27/2011 [facility] Patient Information 2014 [place], [place].

## 2012-08-27 NOTE — Telephone Encounter (Signed)
Patient seen in the office today.

## 2012-09-07 ENCOUNTER — Other Ambulatory Visit: Payer: Self-pay | Admitting: *Deleted

## 2012-09-07 DIAGNOSIS — E559 Vitamin D deficiency, unspecified: Secondary | ICD-10-CM

## 2012-09-07 DIAGNOSIS — D72829 Elevated white blood cell count, unspecified: Secondary | ICD-10-CM

## 2012-09-07 LAB — CBC WITH DIFFERENTIAL/PLATELET
Basophils Absolute: 0 10*3/uL (ref 0.0–0.1)
Basophils Relative: 0 % (ref 0–1)
Eosinophils Absolute: 0.2 10*3/uL (ref 0.0–0.7)
Eosinophils Relative: 1 % (ref 0–5)
HCT: 39.6 % (ref 36.0–46.0)
Hemoglobin: 13.7 g/dL (ref 12.0–15.0)
Lymphocytes Relative: 21 % (ref 12–46)
Lymphs Abs: 2.5 10*3/uL (ref 0.7–4.0)
MCH: 30.6 pg (ref 26.0–34.0)
MCHC: 34.6 g/dL (ref 30.0–36.0)
MCV: 88.4 fL (ref 78.0–100.0)
Monocytes Absolute: 1.2 10*3/uL — ABNORMAL HIGH (ref 0.1–1.0)
Monocytes Relative: 10 % (ref 3–12)
Neutro Abs: 7.9 10*3/uL — ABNORMAL HIGH (ref 1.7–7.7)
Neutrophils Relative %: 68 % (ref 43–77)
Platelets: 430 10*3/uL — ABNORMAL HIGH (ref 150–400)
RBC: 4.48 MIL/uL (ref 3.87–5.11)
RDW: 13.3 % (ref 11.5–15.5)
WBC: 11.8 10*3/uL — ABNORMAL HIGH (ref 4.0–10.5)

## 2012-09-08 LAB — VITAMIN D 25 HYDROXY (VIT D DEFICIENCY, FRACTURES): Vit D, 25-Hydroxy: 22 ng/mL — ABNORMAL LOW (ref 30–89)

## 2012-09-09 DIAGNOSIS — E669 Obesity, unspecified: Secondary | ICD-10-CM | POA: Insufficient documentation

## 2012-09-09 DIAGNOSIS — D72829 Elevated white blood cell count, unspecified: Secondary | ICD-10-CM | POA: Insufficient documentation

## 2012-09-09 DIAGNOSIS — E785 Hyperlipidemia, unspecified: Secondary | ICD-10-CM | POA: Insufficient documentation

## 2012-09-09 DIAGNOSIS — R21 Rash and other nonspecific skin eruption: Secondary | ICD-10-CM | POA: Insufficient documentation

## 2012-09-09 DIAGNOSIS — E559 Vitamin D deficiency, unspecified: Secondary | ICD-10-CM | POA: Insufficient documentation

## 2012-09-09 NOTE — Assessment & Plan Note (Signed)
Her lipid panel has improved from her last check (6 months ago) Total (227 to 198) and LDL (147 to 119).  She will continue to work on her diet and exercise.

## 2012-09-09 NOTE — Assessment & Plan Note (Addendum)
Her WBC count was elevated (14.2).  She has some mild URI symptoms so we'll treat with Zithromax to see if we can get her WBC count to normal.  We'll recheck a CBC in 2 weeks.

## 2012-09-09 NOTE — Assessment & Plan Note (Addendum)
She was given a prescription for Olux foam.

## 2012-09-09 NOTE — Assessment & Plan Note (Signed)
She was given a prescription for Qsymia.  

## 2012-09-09 NOTE — Assessment & Plan Note (Signed)
Her Vitamin D level is low so she is to take OTC Vitamin D 3 2,000 IU daily.

## 2012-09-10 ENCOUNTER — Ambulatory Visit (INDEPENDENT_AMBULATORY_CARE_PROVIDER_SITE_OTHER): Payer: BC Managed Care – PPO | Admitting: Family Medicine

## 2012-09-10 ENCOUNTER — Encounter: Payer: Self-pay | Admitting: Family Medicine

## 2012-09-10 VITALS — BP 125/81 | HR 69 | Wt 191.0 lb

## 2012-09-10 DIAGNOSIS — M549 Dorsalgia, unspecified: Secondary | ICD-10-CM

## 2012-09-10 DIAGNOSIS — D72829 Elevated white blood cell count, unspecified: Secondary | ICD-10-CM

## 2012-09-10 LAB — POCT URINALYSIS DIPSTICK
Bilirubin, UA: NEGATIVE
Glucose, UA: NEGATIVE
Ketones, UA: NEGATIVE
Leukocytes, UA: NEGATIVE
Nitrite, UA: NEGATIVE
Protein, UA: NEGATIVE
Spec Grav, UA: 1.015
Urobilinogen, UA: NEGATIVE
pH, UA: 6

## 2012-09-10 NOTE — Progress Notes (Signed)
  Subjective:    Patient ID: Katrina Huff, female    DOB: Jul 04, 1965, 47 y.o.   MRN: [id number]  HPI  Katrina Huff is back to discuss her most recent lab results.  She completed the Zithromax she was given at her last visit due to her WBC count being elevated  She is complaining of some mild back pain.  She took some Aleve this morning which helped her pain.  She is taking Aleve for her symptoms which have helped her some.    Review of Systems  Genitourinary: Negative for dysuria, urgency, frequency, hematuria and difficulty urinating.  Musculoskeletal: Positive for back pain.  All other systems reviewed and are negative.    Past Medical History  Diagnosis Date  . LGSIL (low grade squamous intraepithelial dysplasia) 10/2007  . Disc disorder     L4,L5    Family History  Problem Relation Age of Onset  . Coronary artery disease Other     x8 (uncles, cousins, grandmother).  . Diabetes Father   . Cancer Father     Carcinoid  . Breast cancer Mother     Age 36  . Breast cancer Maternal Grandmother     Onset postmenopausal  . Coronary artery disease Maternal Grandmother     History   Social History Narrative   Marital Status: Divorced    Children:  Sons (2), Daughter (1)   Pets: Dog/Cat /Fish   Living Situation: Lives with children and roommate.   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 Constitutional: No distress.  Pulmonary/Chest: Effort normal and breath sounds normal. No respiratory distress.  Abdominal: There is no tenderness.  Musculoskeletal: Normal range of motion. She exhibits tenderness. She exhibits no edema.  Psychiatric: She has a normal mood and affect. Her behavior is normal. Judgment and thought content normal.           Assessment & Pla

## 2012-11-13 ENCOUNTER — Other Ambulatory Visit: Payer: Self-pay | Admitting: Neurological Surgery

## 2012-11-29 ENCOUNTER — Encounter (HOSPITAL_COMMUNITY): Payer: Self-pay | Admitting: Pharmacist

## 2012-12-01 ENCOUNTER — Encounter (HOSPITAL_COMMUNITY): Payer: Self-pay | Admitting: Emergency Medicine

## 2012-12-01 ENCOUNTER — Emergency Department (HOSPITAL_COMMUNITY)
Admission: EM | Admit: 2012-12-01 | Discharge: 2012-12-01 | Disposition: A | Discharge status: Home / Self Care | Payer: BC Managed Care – PPO | Source: Home / Self Care | Attending: Family Medicine | Admitting: Family Medicine

## 2012-12-01 DIAGNOSIS — L03012 Cellulitis of left finger: Secondary | ICD-10-CM

## 2012-12-01 DIAGNOSIS — L03019 Cellulitis of unspecified finger: Secondary | ICD-10-CM

## 2012-12-01 MED ORDER — SULFAMETHOXAZOLE-TMP DS 800-160 MG PO TABS: 2.0000 | ORAL_TABLET | Freq: Two times a day (BID) | ORAL | Status: DC

## 2012-12-01 NOTE — ED Provider Notes (Signed)
Medical screening examination/treatment/procedure(s) were performed by a resident physician and as supervising physician I was immediately available for consultation/collaboration.  Leslee Home, M.D.  Reuben Likes, MD 12/01/12 2012

## 2012-12-01 NOTE — ED Provider Notes (Addendum)
CSN: [id number]     Arrival date & time 12/01/12  1730 History   First MD Initiated Contact with Patient 12/01/12 1750     Chief Complaint  Patient presents with  . Wound Infection   (Consider location/radiation/quality/duration/timing/severity/associated sxs/prior Treatment) HPI Comments: Left index finger with pain swelling and redness around the nail bed worsening over the last day. Patient notes she was trying to remove part of her nail yesterday think she caused an infection. She denies any fever chills.   Patient is a 47 y.o. female presenting with hand pain. The history is provided by the patient.  Hand Pain This is a new problem. The current episode started yesterday. The problem occurs constantly. The problem has been rapidly worsening. Pertinent negatives include no chest pain, no abdominal pain, no headaches and no shortness of breath. Exacerbated by: moving finger. Nothing relieves the symptoms. She has tried a warm compress (the patient has tried soaking in Epsom salt) for the symptoms. The treatment provided no relief.    Past Medical History  Diagnosis Date  . LGSIL (low grade squamous intraepithelial dysplasia) 10/2007  . Disc disorder     L4,L5   Past Surgical History  Procedure Laterality Date  . Colposcopy    . Mirena      Inserted 12/2009   Family History  Problem Relation Age of Onset  . Coronary artery disease Other     x8 (uncles, cousins, grandmother).  . Diabetes Father   . Cancer Father     Carcinoid  . Breast cancer Mother     Age 43  . Breast cancer Maternal Grandmother     Onset postmenopausal  . Coronary artery disease Maternal Grandmother    History  Substance Use Topics  . Smoking status: Never Smoker   . Smokeless tobacco: Never Used  . Alcohol Use: Yes     Comment: occasional   OB History   Grav Para Term Preterm Abortions TAB SAB Ect Mult Living   3 3 3       3      Review of Systems  Constitutional: Negative for fever, chills  and appetite change.  Respiratory: Negative for shortness of breath.   Cardiovascular: Negative for chest pain.  Gastrointestinal: Negative for abdominal pain.  Musculoskeletal: Positive for back pain.       Patient with severe spinal stenosis and pain scheduled for surgery on Tuesday with Dr. Marikay Alar, neurosurgeon  Skin: Positive for wound.       Pain around nail of left index finger.  Neurological: Negative for headaches.    Allergies  Review of patient's allergies indicates no known allergies.  Home Medications   Current Outpatient Rx  Name  Route  Sig  Dispense  Refill  . Ascorbic Acid (VITAMIN C) 1000 MG tablet   Oral   Take 1,000 mg by mouth daily.         . cholecalciferol (VITAMIN D) 1000 UNITS tablet   Oral   Take 5,000 Units by mouth daily.         Marland Kitchen HYDROmorphone (DILAUDID) 2 MG tablet   Oral   Take 4 mg by mouth every 6 (six) hours as needed for pain.         Marland Kitchen oxycodone (OXY-IR) 5 MG capsule   Oral   Take 20 mg by mouth every 6 (six) hours as needed.         . sertraline (ZOLOFT) 100 MG tablet   Oral   Take  1.5 tablets (150 mg total) by mouth daily.   135 tablet   4   . diphenhydrAMINE (BENADRYL) 12.5 MG/5ML elixir   Oral   Take 12.5-25 mg by mouth 4 (four) times daily as needed for allergies.         Marland Kitchen levonorgestrel (MIRENA) 20 MCG/24HR IUD   Intrauterine   1 each by Intrauterine route once.         . metaxalone (SKELAXIN) 800 MG tablet   Oral   Take 800 mg by mouth 3 (three) times daily.         Marland Kitchen sulfamethoxazole-trimethoprim (BACTRIM DS) 800-160 MG per tablet   Oral   Take 2 tablets by mouth 2 (two) times daily.   28 tablet   0    BP 143/81  Pulse 66  Temp(Src) 98.4 F (36.9 C) (Oral)  Resp 18  SpO2 95% Physical Exam  Constitutional: She appears well-developed and well-nourished. No distress.  HENT:  Head: Normocephalic and atraumatic.  Musculoskeletal:       Left hand: Normal sensation noted. Normal strength  noted.       Hands: Skin: She is not diaphoretic.    ED Course  Irrigation and debridement of paronychia of left finger Date/Time: 12/01/2012 7:22 PM Performed by: Garnetta Buddy Authorized by: Bradd Canary D Consent: Verbal consent obtained. written consent not obtained. Risks and benefits: risks, benefits and alternatives were discussed Consent given by: patient Preparation: Patient was prepped and draped in the usual sterile fashion. Local anesthesia used: yes Local anesthetic: lidocaine 2% without epinephrine (digital block of left index finger performed) Anesthetic total: 4 ml Patient sedated: no Patient tolerance: Patient tolerated the procedure well with no immediate complications. Comments: Incision and drainage of paronychia of left index finger without complication and produced approximately 10 cc of purulent bloody material which provided pain relief to the patient   (including critical care time) Labs Review Labs Reviewed - No data to display Imaging Review No results found.    MDM   1. Paronychia, finger, left    Left finger s/p I&D which is much improved. She will be given a prescription for Septra to cover to cover for potential MRSA infection.     Garnetta Buddy, MD 12/01/12 1930  Garnetta Buddy, MD 12/01/12 2009

## 2012-12-01 NOTE — ED Notes (Signed)
C/o infected L index finger around nailbed onset yesterday.  Has ichthammol used on her finger yesterday and today. States its a draw out salve.  Has not drained.  Scheduled for back surgery on Tues. ( [facility])

## 2012-12-01 NOTE — ED Provider Notes (Signed)
Medical screening examination/treatment/procedure(s) were performed by a resident physician and as supervising physician I was immediately available for consultation/collaboration.  Leslee Home, M.D.  Reuben Likes, MD 12/01/12 2001

## 2012-12-03 ENCOUNTER — Ambulatory Visit (HOSPITAL_COMMUNITY)
Admission: RE | Admit: 2012-12-03 | Discharge: 2012-12-03 | Disposition: A | Discharge status: Home / Self Care | Payer: BC Managed Care – PPO | Source: Ambulatory Visit | Attending: Neurological Surgery | Admitting: Neurological Surgery

## 2012-12-03 ENCOUNTER — Encounter (HOSPITAL_COMMUNITY)
Admission: RE | Admit: 2012-12-03 | Discharge: 2012-12-03 | Disposition: A | Discharge status: Home / Self Care | Payer: BC Managed Care – PPO | Source: Ambulatory Visit | Attending: Neurological Surgery | Admitting: Neurological Surgery

## 2012-12-03 ENCOUNTER — Encounter (HOSPITAL_COMMUNITY): Payer: Self-pay

## 2012-12-03 DIAGNOSIS — Z01818 Encounter for other preprocedural examination: Secondary | ICD-10-CM | POA: Insufficient documentation

## 2012-12-03 DIAGNOSIS — Z01812 Encounter for preprocedural laboratory examination: Secondary | ICD-10-CM | POA: Insufficient documentation

## 2012-12-03 DIAGNOSIS — M48061 Spinal stenosis, lumbar region without neurogenic claudication: Secondary | ICD-10-CM | POA: Insufficient documentation

## 2012-12-03 DIAGNOSIS — Z01810 Encounter for preprocedural cardiovascular examination: Secondary | ICD-10-CM | POA: Insufficient documentation

## 2012-12-03 HISTORY — DX: Depression, unspecified: F32.A

## 2012-12-03 HISTORY — DX: Anxiety disorder, unspecified: F41.9

## 2012-12-03 HISTORY — DX: Major depressive disorder, single episode, unspecified: F32.9

## 2012-12-03 LAB — SURGICAL PCR SCREEN
MRSA, PCR: NEGATIVE
Staphylococcus aureus: POSITIVE — AB

## 2012-12-03 LAB — TYPE AND SCREEN
ABO/RH(D): A POS
Antibody Screen: NEGATIVE

## 2012-12-03 LAB — CBC WITH DIFFERENTIAL/PLATELET
Eosinophils Absolute: 0.3 10*3/uL (ref 0.0–0.7)
Hemoglobin: 13.9 g/dL (ref 12.0–15.0)
Lymphocytes Relative: 20 % (ref 12–46)
Lymphs Abs: 2.2 10*3/uL (ref 0.7–4.0)
MCH: 30.4 pg (ref 26.0–34.0)
MCHC: 34.6 g/dL (ref 30.0–36.0)
Monocytes Relative: 11 % (ref 3–12)
Neutro Abs: 7.3 10*3/uL (ref 1.7–7.7)
Neutrophils Relative %: 66 % (ref 43–77)
Platelets: 396 10*3/uL (ref 150–400)
RBC: 4.57 MIL/uL (ref 3.87–5.11)
WBC: 11.1 10*3/uL — ABNORMAL HIGH (ref 4.0–10.5)

## 2012-12-03 LAB — BASIC METABOLIC PANEL
BUN: 16 mg/dL (ref 6–23)
CO2: 26 mEq/L (ref 19–32)
Chloride: 97 mEq/L (ref 96–112)
GFR calc non Af Amer: 76 mL/min — ABNORMAL LOW (ref >90)
Glucose, Bld: 100 mg/dL — ABNORMAL HIGH (ref 70–99)
Potassium: 4.4 mEq/L (ref 3.5–5.1)
Sodium: 132 mEq/L — ABNORMAL LOW (ref 135–145)

## 2012-12-03 MED ORDER — CEFAZOLIN SODIUM-DEXTROSE 2-3 GM-% IV SOLR
2.0000 g | INTRAVENOUS | Status: DC
  Filled 2012-12-03: qty 50

## 2012-12-03 NOTE — Progress Notes (Signed)
Pt satted that she was seen by Dr Jens Som, per her request in 2012.  "I was under a lot of stress and wanted to be seen."  Pt had an exercise stress test- negative results.  Did not need to follow up with Dr Jens Som.

## 2012-12-03 NOTE — Pre-Procedure Instructions (Addendum)
Katrina Huff  12/03/2012   Your procedure is scheduled on:  Tuesday, October 14th.  Report to [facility], Main Entrance or Entrance "A" at 12:30PM.  Call this number if you have problems the morning of surgery: [phone]   Remember:   Do not eat food or drink liquids after midnight.   Take these medicines the morning of surgery with A SIP OF WATER: metaxalone (SKELAXIN), sertraline (ZOLOFT),  sulfamethoxazole-trimethoprim (BACTRIM DS).   Take if needed:oxycodone (OXY-IR),HYDROmorphone (DILAUDID)  diphenhydrAMINE (BENADRYL).    Do not wear jewelry, make-up or nail polish.  Do not wear lotions, powders, or perfumes. You may wear deodorant.  Do not shave 48 hours prior to surgery.   Do not bring valuables to the hospital.  [facility] is not responsible or any belongings or valuables.               Contacts, dentures or bridgework may not be worn into surgery.  Leave suitcase in the car. After surgery it may be brought to your room.  For patients admitted to the hospital, discharge time is determined by your treatment team.               Patients discharged the day of surgery will not be allowed to drivehome.  Name and phone number of your driver: -    Special Instructions: Shower with CHG wash (Bactoshield) tonight and again in the am prior to arriving to hospital.   Please read over the following fact sheets that you were given: Pain Booklet, Coughing and Deep Breathing and Surgical Site Infection Prevention

## 2012-12-03 NOTE — Progress Notes (Signed)
Pt was seen in Urgent Care on Sat 12/01/12, with and infected left index finger.  Finger was drained and patient was started on antibiotics sulfa. Pt now has a small red raised rash on the truck of her body.  Pt said she was not going to take any more antibiotic, I instructed her to go back or call Urgent care and allow the MD to change antibiotic if needed.  Patient and mother wanted to see what Dr Yetta Barre would do. I spoke with Erie Noe at Dr Yetta Barre office.  Erie Noe spoke with Dr Yetta Barre and asked patient to come to the office after PAt is completed.

## 2012-12-04 ENCOUNTER — Encounter (HOSPITAL_COMMUNITY)
Admission: RE | Disposition: A | Discharge status: Home / Self Care | Payer: BC Managed Care – PPO | Source: Ambulatory Visit | Attending: Neurological Surgery

## 2012-12-04 ENCOUNTER — Inpatient Hospital Stay (HOSPITAL_COMMUNITY)
Admission: RE | Admit: 2012-12-04 | Discharge: 2012-12-07 | DRG: 756 | Disposition: A | Discharge status: Home / Self Care | Payer: BC Managed Care – PPO | Source: Ambulatory Visit | Attending: Neurological Surgery | Admitting: Neurological Surgery

## 2012-12-04 ENCOUNTER — Encounter (HOSPITAL_COMMUNITY): Payer: Self-pay | Admitting: *Deleted

## 2012-12-04 ENCOUNTER — Encounter (HOSPITAL_COMMUNITY): Payer: BC Managed Care – PPO | Admitting: Anesthesiology

## 2012-12-04 ENCOUNTER — Inpatient Hospital Stay (HOSPITAL_COMMUNITY): Payer: BC Managed Care – PPO | Admitting: Anesthesiology

## 2012-12-04 ENCOUNTER — Inpatient Hospital Stay (HOSPITAL_COMMUNITY): Payer: BC Managed Care – PPO

## 2012-12-04 DIAGNOSIS — Z01812 Encounter for preprocedural laboratory examination: Secondary | ICD-10-CM

## 2012-12-04 DIAGNOSIS — F3289 Other specified depressive episodes: Secondary | ICD-10-CM | POA: Diagnosis present

## 2012-12-04 DIAGNOSIS — M5126 Other intervertebral disc displacement, lumbar region: Secondary | ICD-10-CM | POA: Diagnosis present

## 2012-12-04 DIAGNOSIS — F329 Major depressive disorder, single episode, unspecified: Secondary | ICD-10-CM | POA: Diagnosis present

## 2012-12-04 DIAGNOSIS — Z8249 Family history of ischemic heart disease and other diseases of the circulatory system: Secondary | ICD-10-CM

## 2012-12-04 DIAGNOSIS — Z833 Family history of diabetes mellitus: Secondary | ICD-10-CM

## 2012-12-04 DIAGNOSIS — Q762 Congenital spondylolisthesis: Secondary | ICD-10-CM

## 2012-12-04 DIAGNOSIS — J8410 Pulmonary fibrosis, unspecified: Secondary | ICD-10-CM | POA: Diagnosis present

## 2012-12-04 DIAGNOSIS — M47817 Spondylosis without myelopathy or radiculopathy, lumbosacral region: Principal | ICD-10-CM | POA: Diagnosis present

## 2012-12-04 DIAGNOSIS — Z23 Encounter for immunization: Secondary | ICD-10-CM

## 2012-12-04 DIAGNOSIS — F411 Generalized anxiety disorder: Secondary | ICD-10-CM | POA: Diagnosis present

## 2012-12-04 DIAGNOSIS — Z79899 Other long term (current) drug therapy: Secondary | ICD-10-CM

## 2012-12-04 DIAGNOSIS — Z803 Family history of malignant neoplasm of breast: Secondary | ICD-10-CM

## 2012-12-04 HISTORY — PX: MAXIMUM ACCESS (MAS)POSTERIOR LUMBAR INTERBODY FUSION (PLIF) 1 LEVEL: SHX6368

## 2012-12-04 SURGERY — FOR MAXIMUM ACCESS (MAS) POSTERIOR LUMBAR INTERBODY FUSION (PLIF) 1 LEVEL
Anesthesia: General | Site: Back | Wound class: Clean

## 2012-12-04 MED ORDER — HYDROMORPHONE HCL PF 1 MG/ML IJ SOLN
INTRAMUSCULAR | Status: AC
  Administered 2012-12-04: 0.5 mg via INTRAVENOUS
  Filled 2012-12-04: qty 1

## 2012-12-04 MED ORDER — MIDAZOLAM HCL 2 MG/2ML IJ SOLN: 1.0000 mg | Freq: Once | INTRAMUSCULAR | Status: DC

## 2012-12-04 MED ORDER — DEXAMETHASONE SODIUM PHOSPHATE 10 MG/ML IJ SOLN
10.0000 mg | INTRAMUSCULAR | Status: AC
  Administered 2012-12-04: 10 mg via INTRAVENOUS

## 2012-12-04 MED ORDER — FENTANYL CITRATE 0.05 MG/ML IJ SOLN
50.0000 ug | Freq: Once | INTRAMUSCULAR | Status: AC
  Administered 2012-12-04: 25 ug via INTRAVENOUS
  Administered 2012-12-04: 50 ug via INTRAVENOUS
  Filled 2012-12-04: qty 2

## 2012-12-04 MED ORDER — PROPOFOL 10 MG/ML IV BOLUS
INTRAVENOUS | Status: DC | PRN
  Administered 2012-12-04: 170 mg via INTRAVENOUS
  Administered 2012-12-04: 30 mg via INTRAVENOUS

## 2012-12-04 MED ORDER — OXYCODONE-ACETAMINOPHEN 5-325 MG PO TABS
1.0000 | ORAL_TABLET | ORAL | Status: DC | PRN
  Administered 2012-12-05 – 2012-12-07 (×13): 2 via ORAL
  Filled 2012-12-04 (×13): qty 2

## 2012-12-04 MED ORDER — METHOCARBAMOL 100 MG/ML IJ SOLN
500.0000 mg | Freq: Four times a day (QID) | INTRAVENOUS | Status: DC | PRN
  Filled 2012-12-04: qty 5

## 2012-12-04 MED ORDER — ONDANSETRON HCL 4 MG/2ML IJ SOLN: 4.0000 mg | INTRAMUSCULAR | Status: DC | PRN

## 2012-12-04 MED ORDER — DEXAMETHASONE SODIUM PHOSPHATE 4 MG/ML IJ SOLN
4.0000 mg | Freq: Four times a day (QID) | INTRAMUSCULAR | Status: DC
  Administered 2012-12-04 – 2012-12-06 (×3): 4 mg via INTRAVENOUS
  Filled 2012-12-04 (×15): qty 1

## 2012-12-04 MED ORDER — LIDOCAINE HCL (CARDIAC) 20 MG/ML IV SOLN
INTRAVENOUS | Status: DC | PRN
  Administered 2012-12-04: 60 mg via INTRAVENOUS

## 2012-12-04 MED ORDER — SODIUM CHLORIDE 0.9 % IJ SOLN
3.0000 mL | Freq: Two times a day (BID) | INTRAMUSCULAR | Status: DC
  Administered 2012-12-05 – 2012-12-06 (×4): 3 mL via INTRAVENOUS

## 2012-12-04 MED ORDER — MENTHOL 3 MG MT LOZG: 1.0000 | LOZENGE | OROMUCOSAL | Status: DC | PRN

## 2012-12-04 MED ORDER — MUPIROCIN 2 % EX OINT
TOPICAL_OINTMENT | Freq: Two times a day (BID) | CUTANEOUS | Status: DC
  Administered 2012-12-04 – 2012-12-06 (×4): via NASAL
  Filled 2012-12-04: qty 22

## 2012-12-04 MED ORDER — DEXAMETHASONE 4 MG PO TABS
4.0000 mg | ORAL_TABLET | Freq: Four times a day (QID) | ORAL | Status: DC
  Administered 2012-12-05 – 2012-12-07 (×7): 4 mg via ORAL
  Filled 2012-12-04 (×15): qty 1

## 2012-12-04 MED ORDER — MUPIROCIN 2 % EX OINT
TOPICAL_OINTMENT | CUTANEOUS | Status: AC
  Filled 2012-12-04: qty 22

## 2012-12-04 MED ORDER — OXYCODONE HCL 5 MG PO TABS
ORAL_TABLET | ORAL | Status: AC
  Administered 2012-12-04: 10 mg
  Filled 2012-12-04: qty 2

## 2012-12-04 MED ORDER — MIDAZOLAM HCL 2 MG/2ML IJ SOLN
INTRAMUSCULAR | Status: AC
  Filled 2012-12-04: qty 2

## 2012-12-04 MED ORDER — MIDAZOLAM HCL 5 MG/5ML IJ SOLN
INTRAMUSCULAR | Status: DC | PRN
  Administered 2012-12-04: 2 mg via INTRAVENOUS

## 2012-12-04 MED ORDER — CELECOXIB 200 MG PO CAPS
200.0000 mg | ORAL_CAPSULE | Freq: Two times a day (BID) | ORAL | Status: DC
  Administered 2012-12-04 – 2012-12-06 (×5): 200 mg via ORAL
  Filled 2012-12-04 (×7): qty 1

## 2012-12-04 MED ORDER — DEXAMETHASONE SODIUM PHOSPHATE 10 MG/ML IJ SOLN
INTRAMUSCULAR | Status: AC
  Filled 2012-12-04: qty 1

## 2012-12-04 MED ORDER — LACTATED RINGERS IV SOLN
INTRAVENOUS | Status: DC | PRN
  Administered 2012-12-04 (×3): via INTRAVENOUS

## 2012-12-04 MED ORDER — THROMBIN 20000 UNITS EX SOLR
CUTANEOUS | Status: DC | PRN
  Administered 2012-12-04: 17:00:00 via TOPICAL

## 2012-12-04 MED ORDER — SERTRALINE HCL 50 MG PO TABS
150.0000 mg | ORAL_TABLET | Freq: Every day | ORAL | Status: DC
  Administered 2012-12-05 – 2012-12-06 (×2): 150 mg via ORAL
  Filled 2012-12-04 (×3): qty 1

## 2012-12-04 MED ORDER — SODIUM CHLORIDE 0.9 % IR SOLN
Status: DC | PRN
  Administered 2012-12-04: 17:00:00

## 2012-12-04 MED ORDER — METHOCARBAMOL 500 MG PO TABS
500.0000 mg | ORAL_TABLET | Freq: Four times a day (QID) | ORAL | Status: DC | PRN
  Administered 2012-12-04 – 2012-12-06 (×6): 500 mg via ORAL
  Filled 2012-12-04 (×6): qty 1

## 2012-12-04 MED ORDER — BUPIVACAINE HCL (PF) 0.25 % IJ SOLN
INTRAMUSCULAR | Status: DC | PRN
  Administered 2012-12-04: 10 mL

## 2012-12-04 MED ORDER — SUFENTANIL CITRATE 50 MCG/ML IV SOLN
INTRAVENOUS | Status: DC | PRN
  Administered 2012-12-04: 10 ug via INTRAVENOUS
  Administered 2012-12-04: 20 ug via INTRAVENOUS
  Administered 2012-12-04 (×3): 10 ug via INTRAVENOUS
  Administered 2012-12-04: 20 ug via INTRAVENOUS
  Administered 2012-12-04 (×2): 10 ug via INTRAVENOUS

## 2012-12-04 MED ORDER — POTASSIUM CHLORIDE IN NACL 20-0.9 MEQ/L-% IV SOLN
INTRAVENOUS | Status: DC
  Filled 2012-12-04 (×6): qty 1000

## 2012-12-04 MED ORDER — PHENOL 1.4 % MT LIQD: 1.0000 | OROMUCOSAL | Status: DC | PRN

## 2012-12-04 MED ORDER — ONDANSETRON HCL 4 MG/2ML IJ SOLN
INTRAMUSCULAR | Status: DC | PRN
  Administered 2012-12-04: 4 mg via INTRAMUSCULAR

## 2012-12-04 MED ORDER — KETOROLAC TROMETHAMINE 30 MG/ML IJ SOLN
INTRAMUSCULAR | Status: DC | PRN
  Administered 2012-12-04: 30 mg via INTRAVENOUS

## 2012-12-04 MED ORDER — 0.9 % SODIUM CHLORIDE (POUR BTL) OPTIME
TOPICAL | Status: DC | PRN
  Administered 2012-12-04: 1000 mL

## 2012-12-04 MED ORDER — LACTATED RINGERS IV SOLN
INTRAVENOUS | Status: DC
  Administered 2012-12-04: 14:00:00 via INTRAVENOUS

## 2012-12-04 MED ORDER — INFLUENZA VAC SPLIT QUAD 0.5 ML IM SUSP
0.5000 mL | INTRAMUSCULAR | Status: AC
  Administered 2012-12-05: 0.5 mL via INTRAMUSCULAR
  Filled 2012-12-04: qty 0.5

## 2012-12-04 MED ORDER — ACETAMINOPHEN 650 MG RE SUPP: 650.0000 mg | RECTAL | Status: DC | PRN

## 2012-12-04 MED ORDER — SUCCINYLCHOLINE CHLORIDE 20 MG/ML IJ SOLN
INTRAMUSCULAR | Status: DC | PRN
  Administered 2012-12-04: 100 mg via INTRAVENOUS

## 2012-12-04 MED ORDER — MORPHINE SULFATE 2 MG/ML IJ SOLN
1.0000 mg | INTRAMUSCULAR | Status: DC | PRN
  Administered 2012-12-04 – 2012-12-07 (×11): 2 mg via INTRAVENOUS
  Filled 2012-12-04 (×12): qty 1

## 2012-12-04 MED ORDER — SODIUM CHLORIDE 0.9 % IJ SOLN: 3.0000 mL | INTRAMUSCULAR | Status: DC | PRN

## 2012-12-04 MED ORDER — HYDROMORPHONE HCL PF 1 MG/ML IJ SOLN
0.2500 mg | INTRAMUSCULAR | Status: DC | PRN
  Administered 2012-12-04 (×4): 0.5 mg via INTRAVENOUS

## 2012-12-04 MED ORDER — FENTANYL CITRATE 0.05 MG/ML IJ SOLN
25.0000 ug | Freq: Once | INTRAMUSCULAR | Status: DC
  Filled 2012-12-04: qty 0.5

## 2012-12-04 MED ORDER — ACETAMINOPHEN 325 MG PO TABS: 650.0000 mg | ORAL_TABLET | ORAL | Status: DC | PRN

## 2012-12-04 MED ORDER — CEFAZOLIN SODIUM 1-5 GM-% IV SOLN
1.0000 g | Freq: Three times a day (TID) | INTRAVENOUS | Status: AC
  Administered 2012-12-04 – 2012-12-05 (×2): 1 g via INTRAVENOUS
  Filled 2012-12-04 (×2): qty 50

## 2012-12-04 SURGICAL SUPPLY — 65 items
APL SKNCLS STERI-STRIP NONHPOA (GAUZE/BANDAGES/DRESSINGS) ×1
BAG DECANTER FOR FLEXI CONT (MISCELLANEOUS) ×2 IMPLANT
BENZOIN TINCTURE PRP APPL 2/3 (GAUZE/BANDAGES/DRESSINGS) ×2 IMPLANT
BLADE SURG ROTATE 9660 (MISCELLANEOUS) IMPLANT
BONE MATRIX OSTEOCEL PRO MED (Bone Implant) ×1 IMPLANT
BUR MATCHSTICK NEURO 3.0 LAGG (BURR) ×2 IMPLANT
CAGE COROENT MP 8X23 (Cage) ×2 IMPLANT
CANISTER SUCTION 2500CC (MISCELLANEOUS) ×2 IMPLANT
CLIP NEUROVISION LG (CLIP) ×1 IMPLANT
CONT SPEC 4OZ CLIKSEAL STRL BL (MISCELLANEOUS) ×4 IMPLANT
COVER BACK TABLE 24X17X13 BIG (DRAPES) IMPLANT
COVER TABLE BACK 60X90 (DRAPES) ×2 IMPLANT
DRAPE C-ARM 42X72 X-RAY (DRAPES) ×2 IMPLANT
DRAPE C-ARMOR (DRAPES) ×2 IMPLANT
DRAPE LAPAROTOMY 100X72X124 (DRAPES) ×2 IMPLANT
DRAPE POUCH INSTRU U-SHP 10X18 (DRAPES) ×2 IMPLANT
DRAPE SURG 17X23 STRL (DRAPES) ×2 IMPLANT
DRESSING TELFA 8X3 (GAUZE/BANDAGES/DRESSINGS) ×1 IMPLANT
DRSG OPSITE 4X5.5 SM (GAUZE/BANDAGES/DRESSINGS) ×3 IMPLANT
DRSG OPSITE POSTOP 4X6 (GAUZE/BANDAGES/DRESSINGS) ×1 IMPLANT
DURAPREP 26ML APPLICATOR (WOUND CARE) ×2 IMPLANT
ELECT REM PT RETURN 9FT ADLT (ELECTROSURGICAL) ×2
ELECTRODE REM PT RTRN 9FT ADLT (ELECTROSURGICAL) ×1 IMPLANT
EVACUATOR 1/8 PVC DRAIN (DRAIN) ×2 IMPLANT
GAUZE SPONGE 4X4 16PLY XRAY LF (GAUZE/BANDAGES/DRESSINGS) IMPLANT
GLOVE BIO SURGEON STRL SZ8 (GLOVE) ×4 IMPLANT
GLOVE BIOGEL M 8.0 STRL (GLOVE) ×1 IMPLANT
GLOVE BIOGEL PI IND STRL 7.5 (GLOVE) IMPLANT
GLOVE BIOGEL PI INDICATOR 7.5 (GLOVE) ×2
GLOVE ECLIPSE 7.5 STRL STRAW (GLOVE) ×4 IMPLANT
GOWN BRE IMP SLV AUR LG STRL (GOWN DISPOSABLE) ×1 IMPLANT
GOWN BRE IMP SLV AUR XL STRL (GOWN DISPOSABLE) ×5 IMPLANT
GOWN STRL REIN 2XL LVL4 (GOWN DISPOSABLE) IMPLANT
HEMOSTAT POWDER KIT SURGIFOAM (HEMOSTASIS) IMPLANT
KIT BASIN OR (CUSTOM PROCEDURE TRAY) ×2 IMPLANT
KIT NDL NVM5 EMG ELECT (KITS) IMPLANT
KIT NEEDLE NVM5 EMG ELECT (KITS) ×1 IMPLANT
KIT NEEDLE NVM5 EMG ELECTRODE (KITS) ×1
KIT ROOM TURNOVER OR (KITS) ×2 IMPLANT
MILL MEDIUM DISP (BLADE) ×1 IMPLANT
NDL HYPO 25X1 1.5 SAFETY (NEEDLE) ×1 IMPLANT
NEEDLE HYPO 25X1 1.5 SAFETY (NEEDLE) ×2 IMPLANT
NS IRRIG 1000ML POUR BTL (IV SOLUTION) ×2 IMPLANT
PACK LAMINECTOMY NEURO (CUSTOM PROCEDURE TRAY) ×2 IMPLANT
PAD ARMBOARD 7.5X6 YLW CONV (MISCELLANEOUS) ×6 IMPLANT
ROD 35MM (Rod) ×1 IMPLANT
ROD 5.5X40MM (Rod) ×1 IMPLANT
SCREW LOCK (Screw) ×8 IMPLANT
SCREW LOCK FXNS SPNE MAS PL (Screw) IMPLANT
SCREW MAS PLIF 5.5X30 (Screw) ×2 IMPLANT
SCREW PAS PLIF 5X30 (Screw) IMPLANT
SCREW SHANK 5.0X30MM (Screw) ×2 IMPLANT
SCREW TULIP 5.5 (Screw) ×2 IMPLANT
SPONGE LAP 4X18 X RAY DECT (DISPOSABLE) IMPLANT
SPONGE SURGIFOAM ABS GEL 100 (HEMOSTASIS) ×2 IMPLANT
STRIP CLOSURE SKIN 1/2X4 (GAUZE/BANDAGES/DRESSINGS) ×3 IMPLANT
SUT VIC AB 0 CT1 18XCR BRD8 (SUTURE) ×1 IMPLANT
SUT VIC AB 0 CT1 8-18 (SUTURE) ×4
SUT VIC AB 2-0 CP2 18 (SUTURE) ×2 IMPLANT
SUT VIC AB 3-0 SH 8-18 (SUTURE) ×4 IMPLANT
SYR 20ML ECCENTRIC (SYRINGE) ×2 IMPLANT
TOWEL OR 17X24 6PK STRL BLUE (TOWEL DISPOSABLE) ×2 IMPLANT
TOWEL OR 17X26 10 PK STRL BLUE (TOWEL DISPOSABLE) ×2 IMPLANT
TRAY FOLEY CATH 14FRSI W/METER (CATHETERS) ×2 IMPLANT
WATER STERILE IRR 1000ML POUR (IV SOLUTION) ×2 IMPLANT

## 2012-12-04 NOTE — Anesthesia Postprocedure Evaluation (Signed)
  Anesthesia Post-op Note  Patient: Katrina Huff  Procedure(s) Performed: Procedure(s) with comments: Lumbar four-five Maximum Access Surgery Posterior Lumbar Interbody Fusion (N/A) - Lumbar four-five Maximum Access Surgery Posterior Lumbar Interbody Fusion  Patient Location: PACU  Anesthesia Type:General  Level of Consciousness: awake, alert , oriented and patient cooperative  Airway and Oxygen Therapy: Patient Spontanous Breathing and Patient connected to nasal cannula oxygen  Post-op Pain: mild  Post-op Assessment: Post-op Vital signs reviewed, Patient's Cardiovascular Status Stable, Respiratory Function Stable, Patent Airway, No signs of Nausea or vomiting and Pain level controlled  Post-op Vital Signs: Reviewed and stable  Complications: No apparent anesthesia complications

## 2012-12-04 NOTE — Anesthesia Preprocedure Evaluation (Addendum)
Anesthesia Evaluation  Patient identified by MRN, date of birth, ID bandGeneral Assessment Comment:Dr. Yetta Barre is delayed. Patient is aware and will return to short stay for monitoring and possible pain medication if needed. Michelle from short stay is aware. CE  Reviewed: Allergy & Precautions, H&P , NPO status , Patient's Chart, lab work & pertinent test results  Airway Mallampati: II      Dental   Pulmonary neg pulmonary ROS,  breath sounds clear to auscultation Patient informed of CXR findings and to follow up with Dr. Yetta Barre and a/her family/medicine doctor soon after surgery for care. CE Discussed CXR findings with Dr. Yetta Barre in preop holding with patient right before going back to surgery. Dr.Jones acknowledged above and will follow up with patient. CE        Cardiovascular negative cardio ROS  Rhythm:Regular Rate:Normal     Neuro/Psych    GI/Hepatic negative GI ROS, Neg liver ROS,   Endo/Other  negative endocrine ROS  Renal/GU negative Renal ROS     Musculoskeletal   Abdominal   Peds  Hematology   Anesthesia Other Findings   Reproductive/Obstetrics                        Anesthesia Physical Anesthesia Plan  ASA: II  Anesthesia Plan: General   Post-op Pain Management:    Induction: Intravenous  Airway Management Planned: Oral ETT  Additional Equipment:   Intra-op Plan:   Post-operative Plan: Extubation in OR  Informed Consent: I have reviewed the patients History and Physical, chart, labs and discussed the procedure including the risks, benefits and alternatives for the proposed anesthesia with the patient or authorized representative who has indicated his/her understanding and acceptance.   Dental advisory given  Plan Discussed with: CRNA, Anesthesiologist and Surgeon  Anesthesia Plan Comments:        Anesthesia Quick Evaluation

## 2012-12-04 NOTE — Op Note (Signed)
12/04/2012  6:51 PM  PATIENT:  Katrina Huff  47 y.o. female  PRE-OPERATIVE DIAGNOSIS:  Large lumbar L4-5 disc herniation with spinal stenosis and spondylolisthesis, back and leg pain  POST-OPERATIVE DIAGNOSIS:  Same  PROCEDURE:   1. Decompressive lumbar laminectomy L4-5 requiring more work than would be required for a simple exposure of the disk for PLIF in order to adequately decompress the neural elements and address the spinal stenosis 2. Posterior lumbar interbody fusion  L4-5 using PEEK interbody cages packed with morcellized allograft and autograft 3. Posterior fixation L4-5 using cortical pedicle screws.    SURGEON:  Marikay Alar, MD  ASSISTANTS: Dr. Jeral Fruit  ANESTHESIA:  General  EBL: 100 ml  Total I/O In: 1400 [I.V.:1400] Out: 150 [Urine:150]  BLOOD ADMINISTERED:none  DRAINS: Hemovac   INDICATION FOR PROCEDURE: This patient presented with severe back and bilateral leg pain. She had an MRI which showed a large disc herniation L4-5. She had severe spinal stenosis. She plain films with flexion extension views which show dynamic instability at L4-5. Recommended a decompression and fusion to address her spinal stenosis and segmental instability. Patient understood the risks, benefits, and alternatives and potential outcomes and wished to proceed.  PROCEDURE DETAILS:  The patient was brought to the operating room. After induction of generalized endotracheal anesthesia the patient was rolled into the prone position on chest rolls and all pressure points were padded. The patient's lumbar region was cleaned and then prepped with DuraPrep and draped in the usual sterile fashion. Anesthesia was injected and then a dorsal midline incision was made and carried down to the lumbosacral fascia. The fascia was opened and the paraspinous musculature was taken down in a subperiosteal fashion to expose L4-5. A self-retaining retractor was placed. Intraoperative fluoroscopy confirmed my level,  and I started with placement of the L4 cortical pedicle screws. The pedicle screw entry zones were identified utilizing surface landmarks and  AP and lateral fluoroscopy. I scored the cortex with the high-speed drill and then used the hand drill and EMG monitoring to drill an upward and outward direction into the pedicle. I then tapped line to line, and the tap was also monitored. I then placed a 5 -- 0 x 30 mm cortical pedicle screw into the pedicles of L4 bilaterally. I then turned my attention to the decompression and the spinous process was removed and complete lumbar laminectomies, hemi- facetectomies, and foraminotomies were performed at L4-5. The patient had significant spinal stenosis and this required more work than would be required for a simple exposure of the disc for posterior lumbar interbody fusion. Much more generous decompression was undertaken in order to adequately decompress the neural elements and address the patient's leg pain. I did find a large disc herniation at L4-5 on the right with a large free fragment that was central . The yellow ligament was removed to expose the underlying dura and nerve roots, and generous foraminotomies were performed to adequately decompress the neural elements. Both the exiting and traversing nerve roots were decompressed on both sides until a coronary dilator passed easily along the nerve roots. Once the decompression was complete, I turned my attention to the posterior lower lumbar interbody fusion. The epidural venous vasculature was coagulated and cut sharply. Disc space was incised and the initial discectomy was performed with pituitary rongeurs. The disc space was distracted with sequential distractors to a height of 9 mm. We then used a series of scrapers and shavers to prepare the endplates for fusion. The midline was  prepared with Epstein curettes. Once the complete discectomy was finished, we packed an appropriate sized peek interbody cage with local  autograft and morcellized allograft, gently retracted the nerve root, and tapped the cage into position at L4-5 bilaterally.  The midline between the cages was packed with morselized autograft and allograft. We then turned our attention to the placement of the lower pedicle screws. The pedicle screw entry zones were identified utilizing surface landmarks and fluoroscopy. I drilled into each pedicle utilizing the hand drill and EMG monitoring, and tapped each pedicle with the appropriate tap. We palpated with a ball probe to assure no break in the cortex. We then placed 5-0 x 30 mm pedicle screws into the pedicles bilaterally at L5. We then placed lordotic rods into the multiaxial screw heads of the pedicle screws and locked these in position with the locking caps and anti-torque device. We then checked our construct with AP and lateral fluoroscopy. Irrigated with copious amounts of bacitracin-containing saline solution. Placed a medium Hemovac drain through separate stab incision. Inspected the nerve roots once again to assure adequate decompression, lined to the dura with Gelfoam, and closed the muscle and the fascia with 0 Vicryl. Closed the subcutaneous tissues with 2-0 Vicryl and subcuticular tissues with 3-0 Vicryl. The skin was closed with benzoin and Steri-Strips. Dressing was then applied, the patient was awakened from general anesthesia and transported to the recovery room in stable condition. At the end of the procedure all sponge, needle and instrument counts were correct.   PLAN OF CARE: Admit to inpatient   PATIENT DISPOSITION:  PACU - hemodynamically stable.   Delay start of Pharmacological VTE agent (>24hrs) due to surgical blood loss or risk of bleeding:  yes

## 2012-12-04 NOTE — Transfer of Care (Signed)
Immediate Anesthesia Transfer of Care Note  Patient: Katrina Huff  Procedure(s) Performed: Procedure(s) with comments: Lumbar four-five Maximum Access Surgery Posterior Lumbar Interbody Fusion (N/A) - Lumbar four-five Maximum Access Surgery Posterior Lumbar Interbody Fusion  Patient Location: PACU  Anesthesia Type:General  Level of Consciousness: awake, alert  and oriented  Airway & Oxygen Therapy: Patient Spontanous Breathing and Patient connected to nasal cannula oxygen  Post-op Assessment: Report given to PACU RN, Post -op Vital signs reviewed and stable and Patient moving all extremities  Post vital signs: Reviewed and stable  Complications: No apparent anesthesia complications

## 2012-12-04 NOTE — H&P (Signed)
Subjective: Patient is a 47 y.o. female admitted for PLIF L4-5.  Onset of symptoms was several months ago, gradually worsening since that time.  The pain is rated intense, and is located at the across the lower back and radiates to legs. The pain is described as aching and throbbing and occurs all day. The symptoms have been progressive. Symptoms are exacerbated by exercise. MRI or CT showed stenosis/ HNP/ Spondylolisthesis L4-5.   Past Medical History  Diagnosis Date  . LGSIL (low grade squamous intraepithelial dysplasia) 10/2007  . Disc disorder     L4,L5  . Anxiety   . Mental disorder   . Depression     Past Surgical History  Procedure Laterality Date  . Colposcopy    . Mirena      Inserted 12/2009  . No past surgeries      Prior to Admission medications   Medication Sig Start Date End Date Taking? Authorizing Provider  Ascorbic Acid (VITAMIN C) 1000 MG tablet Take 1,000 mg by mouth daily.   Yes Historical Provider, MD  cholecalciferol (VITAMIN D) 1000 UNITS tablet Take 5,000 Units by mouth daily.   Yes Historical Provider, MD  diphenhydrAMINE (BENADRYL) 12.5 MG/5ML elixir Take 12.5-25 mg by mouth 4 (four) times daily as needed for allergies.   Yes Historical Provider, MD  docusate sodium (COLACE) 100 MG capsule Take 300 mg by mouth daily.   Yes Historical Provider, MD  HYDROmorphone (DILAUDID) 2 MG tablet Take 4 mg by mouth every 6 (six) hours as needed for pain.   Yes Historical Provider, MD  levonorgestrel (MIRENA) 20 MCG/24HR IUD 1 each by Intrauterine route once.   Yes Historical Provider, MD  metaxalone (SKELAXIN) 800 MG tablet Take 800 mg by mouth 3 (three) times daily.   Yes Historical Provider, MD  oxycodone (OXY-IR) 5 MG capsule Take 20 mg by mouth every 6 (six) hours as needed.   Yes Historical Provider, MD  sertraline (ZOLOFT) 100 MG tablet Take 1.5 tablets (150 mg total) by mouth daily. 03/29/12  Yes Harrington Challenger, NP  sulfamethoxazole-trimethoprim (BACTRIM DS) 800-160  MG per tablet Take 2 tablets by mouth 2 (two) times daily. 12/01/12  Yes Garnetta Buddy, MD   Allergies  Allergen Reactions  . Septra [Sulfamethoxazole-Tmp Ds] Rash    Mild rash    History  Substance Use Topics  . Smoking status: Never Smoker   . Smokeless tobacco: Never Used  . Alcohol Use: 0.6 oz/week    1 Glasses of wine per week     Comment: occasional    Family History  Problem Relation Age of Onset  . Coronary artery disease Other     x8 (uncles, cousins, grandmother).  . Diabetes Father   . Cancer Father     Carcinoid  . Breast cancer Mother     Age 26  . Breast cancer Maternal Grandmother     Onset postmenopausal  . Coronary artery disease Maternal Grandmother      Review of Systems  Positive ROS: neg  All other systems have been reviewed and were otherwise negative with the exception of those mentioned in the HPI and as above.  Objective: Vital signs in last 24 hours: Temp:  [98.2 F (36.8 C)] 98.2 F (36.8 C) (10/14 1314) Pulse Rate:  [74] 74 (10/14 1314) Resp:  [18] 18 (10/14 1314) BP: (143)/(82) 143/82 mmHg (10/14 1314) SpO2:  [98 %-99 %] 99 % (10/14 1400)  General Appearance: Alert, cooperative, no distress, appears stated age  Head: Normocephalic, without obvious abnormality, atraumatic Eyes: PERRL, conjunctiva/corneas clear, EOM's intact    Neck: Supple, symmetrical, trachea midline Back: Symmetric, no curvature, ROM normal, no CVA tenderness Lungs:  respirations unlabored Heart: Regular rate and rhythm Abdomen: Soft, non-tender Extremities: Extremities normal, atraumatic, no cyanosis or edema Pulses: 2+ and symmetric all extremities Skin: Skin color, texture, turgor normal, no rashes or lesions  NEUROLOGIC:   Mental status: Alert and oriented x4,  no aphasia, good attention span, fund of knowledge, and memory Motor Exam - grossly normal Sensory Exam - grossly normal Reflexes: 1+ Coordination - grossly normal Gait - grossly  normal Balance - grossly normal Cranial Nerves: I: smell Not tested  II: visual acuity  OS: nl    OD: nl  II: visual fields Full to confrontation  II: pupils Equal, round, reactive to light  III,VII: ptosis None  III,IV,VI: extraocular muscles  Full ROM  V: mastication Normal  V: facial light touch sensation  Normal  V,VII: corneal reflex  Present  VII: facial muscle function - upper  Normal  VII: facial muscle function - lower Normal  VIII: hearing Not tested  IX: soft palate elevation  Normal  IX,X: gag reflex Present  XI: trapezius strength  5/5  XI: sternocleidomastoid strength 5/5  XI: neck flexion strength  5/5  XII: tongue strength  Normal    Data Review Lab Results  Component Value Date   WBC 11.1* 12/03/2012   HGB 13.9 12/03/2012   HCT 40.2 12/03/2012   MCV 88.0 12/03/2012   PLT 396 12/03/2012   Lab Results  Component Value Date   NA 132* 12/03/2012   K 4.4 12/03/2012   CL 97 12/03/2012   CO2 26 12/03/2012   BUN 16 12/03/2012   CREATININE 0.90 12/03/2012   GLUCOSE 100* 12/03/2012   No results found for this basename: INR, PROTIME    Assessment/Plan: Patient admitted for PLIF L4-5. Patient has failed a reasonable attempt at conservative therapy.  I explained the condition and procedure to the patient and answered any questions.  Patient wishes to proceed with procedure as planned. Understands risks/ benefits and typical outcomes of procedure.   Theodora Lalanne S 12/04/2012 4:06 PM

## 2012-12-04 NOTE — Preoperative (Signed)
Beta Blockers   Reason not to administer Beta Blockers:Not Applicable 

## 2012-12-05 ENCOUNTER — Encounter (HOSPITAL_COMMUNITY): Payer: Self-pay | Admitting: Neurological Surgery

## 2012-12-05 NOTE — Evaluation (Signed)
Physical Therapy Evaluation Patient Details Name: Katrina Huff MRN: [id number] DOB: 15-Jan-1966 Today's Date: 12/05/2012 Time: 1201-1227 PT Time Calculation (min): 26 min  PT Assessment / Plan / Recommendation History of Present Illness  47 y.o. female admitted to [facility] on 12/04/12 for elective L4-5 PLIF.    Clinical Impression  Pt is POD#1 s/p lumbar fusion surgery and is moving well despite anxiety surrounding her pain level.  She was able to demonstrate safety on a full flight of stairs and is excited that she does not have to sleep downstairs.   PT to follow acutely for deficits listed below.       PT Assessment  Patient needs continued PT services    Follow Up Recommendations  No PT follow up;Supervision for mobility/OOB    Does the patient have the potential to tolerate intense rehabilitation     NA  Barriers to Discharge   None      Equipment Recommendations  None recommended by PT    Recommendations for Other Services   None  Frequency Min 5X/week    Precautions / Restrictions Precautions Precautions: Back Precaution Booklet Issued: Yes (comment) Precaution Comments: educated pt on back precautions, brace use, and lifting restrictions Required Braces or Orthoses: Spinal Brace Spinal Brace: Applied in sitting position   Pertinent Vitals/Pain See vitals flow sheet.       Mobility  Bed Mobility Bed Mobility: Rolling Left;Left Sidelying to Sit;Sitting - Scoot to Edge of Bed Rolling Left: 6: Modified independent (Device/Increase time);With rail Left Sidelying to Sit: 6: Modified independent (Device/Increase time);With rails Sitting - Scoot to Edge of Bed: With rail Sit to Sidelying Left: 5: Supervision Details for Bed Mobility Assistance: pt needed extra time and relied on the railing, but was able to demonstrate safe log roll technique.   Transfers Transfers: Sit to Stand;Stand to Sit Sit to Stand: 5: Supervision;From elevated surface;With upper extremity  assist;With armrests;From bed Stand to Sit: 5: Supervision;With upper extremity assist;With armrests;To chair/3-in-1 Details for Transfer Assistance: supervision for safety due to slow speed of transitions and heavy reliance on hands.   Ambulation/Gait Ambulation/Gait Assistance: 5: Supervision Ambulation Distance (Feet): 300 Feet Assistive device: None Ambulation/Gait Assistance Details: supervision for safety due to slow gait speed.  Gait Pattern: Within Functional Limits Gait velocity: decreased due to pain Stairs: Yes Stairs Assistance: 5: Supervision Stairs Assistance Details (indicate cue type and reason): supervision for safety at pt showed some signs of functional LE weakness as she went up and down the stairs with reciprocal pattern.   Stair Management Technique: One rail Right;Alternating pattern;Forwards Number of Stairs: 18        PT Diagnosis: Difficulty walking;Abnormality of gait;Acute pain  PT Problem List: Decreased activity tolerance;Decreased balance;Decreased mobility;Decreased knowledge of precautions;Pain PT Treatment Interventions: DME instruction;Gait training;Stair training;Functional mobility training;Therapeutic activities;Therapeutic exercise;Balance training;Neuromuscular re-education;Patient/family education;Modalities     PT Goals(Current goals can be found in the care plan section) Acute Rehab PT Goals Patient Stated Goal: to go home and sleep upstairs PT Goal Formulation: With patient Time For Goal Achievement: 12/12/12 Potential to Achieve Goals: Good  Visit Information  Last PT Received On: 12/05/12 Assistance Needed: +1 History of Present Illness: 47 y.o. female admitted to [facility] on 12/04/12 for elective L4-5 PLIF.         Prior Functioning  Home Living Family/patient expects to be discharged to:: Private residence Living Arrangements: Children;Other relatives Available Help at Discharge: Family;Available 24 hours/day Type of Home:  House Home Access: Stairs to enter [employer]  of Steps: 5 Entrance Stairs-Rails: Right;Left Home Layout: Two level;Able to live on main level with bedroom/bathroom Alternate Level Stairs-Number of Steps: flight Alternate Level Stairs-Rails: Right;Left (alternate sides 1/2 way up) Home Equipment: Adaptive equipment Adaptive Equipment: Reacher Prior Function Level of Independence: Independent Comments: was not able to be as active as she was normally in years past Communication Communication: No difficulties    Cognition  Cognition Arousal/Alertness: Awake/alert Behavior During Therapy: WFL for tasks assessed/performed;Anxious Overall Cognitive Status: Within Functional Limits for tasks assessed    Extremity/Trunk Assessment Upper Extremity Assessment Upper Extremity Assessment: Defer to OT evaluation Lower Extremity Assessment Lower Extremity Assessment: Overall WFL for tasks assessed Cervical / Trunk Assessment Cervical / Trunk Assessment: Normal      End of Session PT - End of Session Equipment Utilized During Treatment: Back brace Activity Tolerance: Patient limited by pain Patient left: in chair;with call bell/phone within reach Nurse Communication: Mobility status    Lurena Joiner B. Purl Claytor, PT, DPT [phone]   12/05/2012, 3:09 PM

## 2012-12-05 NOTE — Progress Notes (Signed)
Patient ID: Katrina Huff, female   DOB: 12/27/1965, 47 y.o.   MRN: [id number] Subjective: Patient reports she's doing great. Back sore. NO LEG PAIN!  Objective: Vital signs in last 24 hours: Temp:  [97.4 F (36.3 C)-98.7 F (37.1 C)] 97.4 F (36.3 C) (10/15 0741) Pulse Rate:  [56-87] 56 (10/15 0741) Resp:  [12-18] 18 (10/15 0741) BP: (92-152)/(55-88) 103/63 mmHg (10/15 0741) SpO2:  [95 %-100 %] 97 % (10/15 0741)  Intake/Output from previous day: 10/14 0701 - 10/15 0700 In: 2000 [I.V.:2000] Out: 930 [Urine:750; Drains:80; Blood:100] Intake/Output this shift:    Neurologic: Grossly normal  Lab Results: Lab Results  Component Value Date   WBC 11.1* 12/03/2012   HGB 13.9 12/03/2012   HCT 40.2 12/03/2012   MCV 88.0 12/03/2012   PLT 396 12/03/2012   No results found for this basename: INR, PROTIME   BMET Lab Results  Component Value Date   NA 132* 12/03/2012   K 4.4 12/03/2012   CL 97 12/03/2012   CO2 26 12/03/2012   GLUCOSE 100* 12/03/2012   BUN 16 12/03/2012   CREATININE 0.90 12/03/2012   CALCIUM 9.5 12/03/2012    Studies/Results: Dg Chest 2 View  12/03/2012   CLINICAL DATA:  Preoperative evaluation for lumbar fusion  EXAM: CHEST  2 VIEW  COMPARISON:  07/25/2011  FINDINGS: The cardiac shadow is within normal limits. The lungs are well aerated bilaterally. A tiny nodular density is noted overlying the mid right lung laterally consistent with a prior calcified granuloma. This is stable from the prior exam. No new focal abnormality is seen.  IMPRESSION: No acute abnormality noted.   Electronically Signed   By: Alcide Clever M.D.   On: 12/03/2012 15:58   Dg Lumbar Spine 2-3 Views  12/04/2012   CLINICAL DATA:  Lumbar fusion.  EXAM: DG C-ARM 1-60 MIN; LUMBAR SPINE - 2-3 VIEW  COMPARISON:  MRI lumbar spine 11/12/2012.  FINDINGS: We are provided with 2 fluoroscopic intraoperative spot views of the lumbar spine. Images demonstrate placement of pedicle screws and  stabilization bars with interbody spacer at L4-5.  IMPRESSION: L4-5 discectomy and fusion.   Electronically Signed   By: Drusilla Kanner M.D.   On: 12/04/2012 19:00   Dg C-arm 1-60 Min  12/04/2012   CLINICAL DATA:  Lumbar fusion.  EXAM: DG C-ARM 1-60 MIN; LUMBAR SPINE - 2-3 VIEW  COMPARISON:  MRI lumbar spine 11/12/2012.  FINDINGS: We are provided with 2 fluoroscopic intraoperative spot views of the lumbar spine. Images demonstrate placement of pedicle screws and stabilization bars with interbody spacer at L4-5.  IMPRESSION: L4-5 discectomy and fusion.   Electronically Signed   By: Drusilla Kanner M.D.   On: 12/04/2012 19:00    Assessment/Plan: Doing well. Likely home tomorrow since surgery was so late yesterday.   LOS: 1 day    Cece Milhouse S 12/05/2012, 9:24 AM

## 2012-12-05 NOTE — Evaluation (Signed)
Occupational Therapy Evaluation Patient Details Name: Katrina Huff MRN: [id number] DOB: 1966/01/15 Today's Date: 12/05/2012 Time: 7829-5621 OT Time Calculation (min): 32 min  OT Assessment / Plan / Recommendation History of present illness s/p L4-5 PLIF   Clinical Impression    Pt s/p L4-5 PLIF.  Pt very limited today by back pain and declining to get OOB with OT.  Began education with pt regarding back precautions and ADL.  Will continue to follow for at least one more session to address below problem list and complete ADL education.      OT Assessment  Patient needs continued OT Services    Follow Up Recommendations  No OT follow up;Supervision/Assistance - 24 hour    Barriers to Discharge      Equipment Recommendations  3 in 1 bedside comode    Recommendations for Other Services    Frequency  Min 2X/week    Precautions / Restrictions Precautions Precautions: Back Precaution Comments: Educated pt on 3/3 back precautions. Required Braces or Orthoses: Spinal Brace Spinal Brace: Applied in sitting position   Pertinent Vitals/Pain See vitals    ADL  Eating/Feeding: Performed;Independent Where Assessed - Eating/Feeding: Bed level Grooming: Performed;Wash/dry face;Set up Where Assessed - Grooming: Unsupported sitting Upper Body Bathing: Simulated;Set up Where Assessed - Upper Body Bathing: Unsupported sitting Lower Body Bathing: Simulated;Moderate assistance Where Assessed - Lower Body Bathing: Unsupported sitting Upper Body Dressing: Performed;Set up Where Assessed - Upper Body Dressing: Unsupported sitting Lower Body Dressing: Simulated;Moderate assistance Where Assessed - Lower Body Dressing: Unsupported sitting Equipment Used: Back brace Transfers/Ambulation Related to ADLs: Pt refusing to stand due to pain. ADL Comments: Began education on back precautions and ADL techniques.  Pt distracted by pain throughout session.  Sat EOB with pt to educated on  donning/doffing brace and in prep for mobility.  After donning brace, pt stated she did not want to stand up when she is in pain and would do it later after her pain meds (pt not due to pain meds yet per RN report).  Pt unable to cross ankles over knees in order to donn/doff socks.   OT Diagnosis: Acute pain  OT Problem List: Decreased activity tolerance;Pain;Decreased knowledge of precautions;Decreased knowledge of use of DME or AE OT Treatment Interventions: Self-care/ADL training;DME and/or AE instruction;Therapeutic activities;Patient/family education   OT Goals(Current goals can be found in the care plan section) Acute Rehab OT Goals Patient Stated Goal: to play tennis again OT Goal Formulation: With patient Time For Goal Achievement: 12/12/12 Potential to Achieve Goals: Good  Visit Information  Last OT Received On: 12/05/12 Assistance Needed: +1 History of Present Illness: s/p L4-5 PLIF       Prior Functioning     Home Living Family/patient expects to be discharged to:: Private residence Living Arrangements: Children;Other relatives Available Help at Discharge: Family;Available 24 hours/day Type of Home: House Home Access: Stairs to enter [employer] of Steps: 5 Home Layout: Two level;Able to live on main level with bedroom/bathroom Alternate Level Stairs-Number of Steps: full flight Home Equipment: Adaptive equipment Adaptive Equipment: Reacher Prior Function Level of Independence: Independent         Vision/Perception     Cognition  Cognition Arousal/Alertness: Awake/alert Behavior During Therapy: WFL for tasks assessed/performed;Anxious (anxious with pain) Overall Cognitive Status: Within Functional Limits for tasks assessed    Extremity/Trunk Assessment Upper Extremity Assessment Upper Extremity Assessment: Overall WFL for tasks assessed     Mobility Bed Mobility Bed Mobility: Rolling Left;Left Sidelying to Sit;Sit to Sidelying Left  Rolling  Left: 5: Supervision;With rail Left Sidelying to Sit: 5: Supervision;With rails Sit to Sidelying Left: 5: Supervision Details for Bed Mobility Assistance: VCs for technique and hand placement.Incr time due to pain,     Exercise     Balance     End of Session OT - End of Session Activity Tolerance: Patient limited by pain Patient left: in bed;with call bell/phone within reach;with family/visitor present Nurse Communication: Patient requests pain meds  GO   12/05/2012 Cipriano Mile OTR/L Pager [phone] Office [phone]   Cipriano Mile 12/05/2012, 2:54 PM

## 2012-12-06 NOTE — Progress Notes (Signed)
Pt states she needed 3n1 but no RW need. Contacted [facility] DME rep. Isidoro Donning RN CCM Case Mgmt phone [phone]

## 2012-12-06 NOTE — Progress Notes (Signed)
Occupational Therapy Treatment Patient Details Name: Khrystina Bonnes MRN: [id number] DOB: 06-Jun-1965 Today's Date: 12/06/2012 Time: 4098-1191 OT Time Calculation (min): 65 min  OT Assessment / Plan / Recommendation  History of present illness 47 y.o. female admitted to [facility] on 12/04/12 for elective L4-5 PLIF.     OT comments  Pt making good progress with functional goals. Pt provided with back sex education handout per her request. Pt extremely talkative, required extensive amount of time to comlpete ADL/shower tasks and required constant redirection to stay on tasks, pt talking on phone when entering room and kept asking therapist to  " give her a second while she talked to vet's office about her cat ". Pt easliy distracted and fosusing on things not relevant to her ADL/shower activity    Follow Up Recommendations  No OT follow up;Supervision/Assistance - 24 hour    Barriers to Discharge   None    Equipment Recommendations  3 in 1 bedside comode    Recommendations for Other Services    Frequency Min 2X/week   Progress towards OT Goals Progress towards OT goals: Progressing toward goals  Plan Discharge plan remains appropriate    Precautions / Restrictions Precautions Precautions: Back Precaution Comments: pt able to verbalize 3/3 back precautions Required Braces or Orthoses: Spinal Brace Restrictions Weight Bearing Restrictions: No   Pertinent Vitals/Pain 3/10    ADL  Grooming: Performed;Wash/dry hands;Wash/dry face;Teeth care;Brushing hair;Set up Upper Body Bathing: Performed;Set up Lower Body Bathing: Performed;Minimal assistance Upper Body Dressing: Performed;Set up Lower Body Dressing: Performed;Minimal assistance Toilet Transfer: Performed;Modified independent Acupuncturist: Regular height toilet;Grab bars Toileting - Clothing Manipulation and Hygiene: Performed;Minimal assistance Where Assessed - Engineer, mining and Hygiene:  Standing Tub/Shower Transfer: Engineer, manufacturing Method: Science writer: Grab bars;Walk in shower;Shower seat with back Equipment Used: Rolling walker;Back brace ADL Comments: pt extremely talkative, required extensive amount of time to comlpete ADL/shower tasks and required constant redirection to stay on tasks, pt talking on phone when entering room and kept asking therapist to  " give her a second while she talked to vet's office about her cat ". Pt easliy distracted and focusing on things not relevant to her ADL/shower activity    OT Diagnosis:    OT Problem List:   OT Treatment Interventions:     OT Goals(current goals can now be found in the care plan section)    Visit Information  Last OT Received On: 12/06/12 History of Present Illness: 47 y.o. female admitted to [facility] on 12/04/12 for elective L4-5 PLIF.      Subjective Data      Prior Functioning       Cognition  Cognition Arousal/Alertness: Awake/alert Behavior During Therapy: WFL for tasks assessed/performed;Anxious Overall Cognitive Status: Within Functional Limits for tasks assessed    Mobility  Bed Mobility Bed Mobility: Rolling Right;Right Sidelying to Sit;Sitting - Scoot to Edge of Bed Rolling Right: 6: Modified independent (Device/Increase time) Right Sidelying to Sit: 6: Modified independent (Device/Increase time) Sitting - Scoot to Edge of Bed: 6: Modified independent (Device/Increase time) Transfers Transfers: Sit to Stand;Stand to Sit Sit to Stand: 6: Modified independent (Device/Increase time);With upper extremity assist;From bed;Other (comment);From toilet (from shower) Stand to Sit: 6: Modified independent (Device/Increase time);With upper extremity assist;With armrests;Other (comment);To toilet (to shower)    Exercises      Balance Balance Balance Assessed: Yes Dynamic Standing Balance Dynamic Standing - Balance Support: No upper extremity  supported;During functional activity Dynamic Standing - Level of Assistance:  5: Stand by assistance   End of Session OT - End of Session Equipment Utilized During Treatment: Back brace Activity Tolerance: Patient tolerated treatment well Patient left: in bed;with call bell/phone within reach  GO     Galen Manila 12/06/2012, 4:04 PM

## 2012-12-06 NOTE — Progress Notes (Signed)
Subjective: Patient reports Overall she's doing okay legs feel much better however the back pain is still significant quite under well enough controlled for her to be discharged.  Objective: Vital signs in last 24 hours: Temp:  [97.4 F (36.3 C)-98.9 F (37.2 C)] 98.6 F (37 C) (10/16 0307) Pulse Rate:  [56-75] 64 (10/16 0307) Resp:  [18-20] 18 (10/16 0307) BP: (97-125)/(61-67) 103/61 mmHg (10/16 0307) SpO2:  [96 %-98 %] 97 % (10/16 0307)  Intake/Output from previous day: 10/15 0701 - 10/16 0700 In: 840 [P.O.:840] Out: 660 [Urine:600; Drains:60] Intake/Output this shift: Total I/O In: -  Out: 25 [Drains:25]  Awake alert strength out of 5 wound clean and dry drain output minimal  Lab Results:  Recent Labs  12/03/12 1449  WBC 11.1*  HGB 13.9  HCT 40.2  PLT 396   BMET  Recent Labs  12/03/12 1449  NA 132*  K 4.4  CL 97  CO2 26  GLUCOSE 100*  BUN 16  CREATININE 0.90  CALCIUM 9.5    Studies/Results: Dg Lumbar Spine 2-3 Views  12/04/2012   CLINICAL DATA:  Lumbar fusion.  EXAM: DG C-ARM 1-60 MIN; LUMBAR SPINE - 2-3 VIEW  COMPARISON:  MRI lumbar spine 11/12/2012.  FINDINGS: We are provided with 2 fluoroscopic intraoperative spot views of the lumbar spine. Images demonstrate placement of pedicle screws and stabilization bars with interbody spacer at L4-5.  IMPRESSION: L4-5 discectomy and fusion.   Electronically Signed   By: Drusilla Kanner M.D.   On: 12/04/2012 19:00   Dg C-arm 1-60 Min  12/04/2012   CLINICAL DATA:  Lumbar fusion.  EXAM: DG C-ARM 1-60 MIN; LUMBAR SPINE - 2-3 VIEW  COMPARISON:  MRI lumbar spine 11/12/2012.  FINDINGS: We are provided with 2 fluoroscopic intraoperative spot views of the lumbar spine. Images demonstrate placement of pedicle screws and stabilization bars with interbody spacer at L4-5.  IMPRESSION: L4-5 discectomy and fusion.   Electronically Signed   By: Drusilla Kanner M.D.   On: 12/04/2012 19:00    Assessment/Plan: Progressive  mobilization continued work on pain management DC her Hemovac  LOS: 2 days     Katrina Huff P 12/06/2012, 6:27 AM

## 2012-12-06 NOTE — Progress Notes (Signed)
Physical Therapy Treatment Patient Details Name: Katrina Huff MRN: [id number] DOB: 05/31/1965 Today's Date: 12/06/2012 Time: 7829-5621 PT Time Calculation (min): 59 min  PT Assessment / Plan / Recommendation  History of Present Illness 47 y.o. female admitted to [facility] on 12/04/12 for elective L4-5 PLIF.     PT Comments   Pt is POD #2 s/p lumbar spine surgery.  She still needs minimal cues for safety and functional application of back precautions.    Follow Up Recommendations  No PT follow up;Supervision for mobility/OOB     Does the patient have the potential to tolerate intense rehabilitation    NA  Barriers to Discharge   None      Equipment Recommendations  None recommended by PT    Recommendations for Other Services   None  Frequency Min 5X/week   Progress towards PT Goals Progress towards PT goals: Progressing toward goals  Plan Current plan remains appropriate    Precautions / Restrictions Precautions Precautions: Back Precaution Comments: reviewed back precautions, pt able to report 2/3 and got the third (arching) with some cues.   Required Braces or Orthoses: Spinal Brace Spinal Brace: Applied in sitting position Restrictions Weight Bearing Restrictions: No   Pertinent Vitals/Pain See vitals flow sheet.     Mobility  Bed Mobility Rolling Left: 6: Modified independent (Device/Increase time);With rail Left Sidelying to Sit: 6: Modified independent (Device/Increase time);With rails;HOB elevated Sitting - Scoot to Edge of Bed: 6: Modified independent (Device/Increase time);With rail Sit to Sidelying Left: 6: Modified independent (Device/Increase time);With rail;HOB elevated Details for Bed Mobility Assistance: pt able to demonstrate correct log roll technique will need to practice with HOB flat and no railings.   Transfers Transfers: Sit to Stand;Stand to Sit Sit to Stand: 6: Modified independent (Device/Increase time);With upper extremity assist;From bed Stand  to Sit: 6: Modified independent (Device/Increase time);With upper extremity assist;With armrests Details for Transfer Assistance: pt relying on arms for support during transitions.   Ambulation/Gait Ambulation/Gait Assistance: 5: Supervision Ambulation Distance (Feet): 350 Feet Assistive device: None Ambulation/Gait Assistance Details: supervision for safety due to mild staggering while walking and talking.  Gait Pattern: Within Functional Limits (mildly staggering. ) Gait velocity: improved, but still decreased due to pain Stairs: Yes Stairs Assistance: 5: Supervision Stairs Assistance Details (indicate cue type and reason): supervision for safety.  Better eccentric quad control when descending the stairs.   Stair Management Technique: One rail Right;Alternating pattern;Forwards Number of Stairs: 18      PT Goals (current goals can now be found in the care plan section) Acute Rehab PT Goals Patient Stated Goal: to go home and sleep upstairs  Visit Information  Last PT Received On: 12/06/12 Assistance Needed: +1 History of Present Illness: 47 y.o. female admitted to [facility] on 12/04/12 for elective L4-5 PLIF.      Subjective Data  Subjective: Pt reports that she is continuing to have significant post-op pain, but still no pain in her legs.   Patient Stated Goal: to go home and sleep upstairs   Cognition  Cognition Arousal/Alertness: Awake/alert Behavior During Therapy: WFL for tasks assessed/performed;Anxious Overall Cognitive Status: Within Functional Limits for tasks assessed       End of Session PT - End of Session Equipment Utilized During Treatment: Back brace Activity Tolerance: Patient limited by pain Patient left: in bed;with call bell/phone within reach     [place] B. Vihaan Gloss, PT, DPT [phone]   12/06/2012, 11:20 AM

## 2012-12-07 ENCOUNTER — Telehealth: Payer: Self-pay | Admitting: *Deleted

## 2012-12-07 MED ORDER — OXYCODONE HCL 5 MG PO TABS: 15.0000 mg | ORAL_TABLET | ORAL | Status: DC | PRN

## 2012-12-07 NOTE — Discharge Summary (Signed)
  Physician Discharge Summary  Patient ID: Katrina Huff MRN: [id number] DOB/AGE: 1965-02-28 47 y.o.  Admit date: 12/04/2012 Discharge date: 12/07/2012  Admission Diagnoses: Lumbar spondylosis with stenosis  Discharge Diagnoses: Same Active Problems:   * No active hospital problems. *   Discharged Condition: good  Hospital Course: Patient admitted the hospital underwent a minimal access posterior lumbar interbody fusion did fairly well had some difficulties with pain control initial couple postoperative days requiring an additional 24 hours same hospital but by postop day 3 patient was stable for discharge home.  Consults: Significant Diagnostic Studies: Treatments: Minimal access with severe lumbar interbody fusion Discharge Exam: Blood pressure 106/82, pulse 72, temperature 98.7 F (37.1 C), temperature source Oral, resp. rate 18, SpO2 100.00%. Strength out of 5 wound clean and dry  Disposition: Home   Future Appointments Provider Department Dept Phone   02/04/2013 10:15 AM [facility] Nurse [place] [facility] [place] [phone]   02/07/2013 2:00 PM Gillian Scarce, MD [facility] [place] [phone]       Medication List         cholecalciferol 1000 UNITS tablet  Commonly known as:  VITAMIN D  Take 5,000 Units by mouth daily.     diphenhydrAMINE 12.5 MG/5ML elixir  Commonly known as:  BENADRYL  Take 12.5-25 mg by mouth 4 (four) times daily as needed for allergies.     docusate sodium 100 MG capsule  Commonly known as:  COLACE  Take 300 mg by mouth daily.     HYDROmorphone 2 MG tablet  Commonly known as:  DILAUDID  Take 4 mg by mouth every 6 (six) hours as needed for pain.     levonorgestrel 20 MCG/24HR IUD  Commonly known as:  MIRENA  1 each by Intrauterine route once.     metaxalone 800 MG tablet  Commonly known as:  SKELAXIN  Take 800 mg by mouth 3 (three) times daily.     oxyCODONE 5 MG immediate release  tablet  Commonly known as:  ROXICODONE  Take 3 tablets (15 mg total) by mouth every 4 (four) hours as needed for pain.     oxycodone 5 MG capsule  Commonly known as:  OXY-IR  Take 20 mg by mouth every 6 (six) hours as needed.     sertraline 100 MG tablet  Commonly known as:  ZOLOFT  Take 1.5 tablets (150 mg total) by mouth daily.     sulfamethoxazole-trimethoprim 800-160 MG per tablet  Commonly known as:  BACTRIM DS  Take 2 tablets by mouth 2 (two) times daily.     vitamin C 1000 MG tablet  Take 1,000 mg by mouth daily.           Follow-up Information   Follow up with Tia Alert, MD.   Specialty:  Neurosurgery   Contact information:   [address] [place] [address] [phone]       Signed: Charls Custer P 12/07/2012, 10:14 AM

## 2012-12-07 NOTE — Progress Notes (Signed)
Pt doing well. Pt and mother given D/C instructions with Rx, verbal understanding was given. Pt received potty chair from advance prior to D/C. Pt D/C'd home via wheelchair @ 1155 per MD order. Rema Fendt, RN

## 2012-12-07 NOTE — Progress Notes (Signed)
Utilization review completed. Matthieu Loftus RN CCM Case Mgmt  

## 2012-12-07 NOTE — Progress Notes (Signed)
Patient ID: Katrina Huff, female   DOB: 04/28/1965, 47 y.o.   MRN: [id number] Pain and a much better control mobilized progressively but stable for discharge home neurologically intact wound clean and dry

## 2012-12-07 NOTE — Telephone Encounter (Signed)
Katrina Huff is concerned about a recent chest x-ray.  There was a spot on the chest x-ray (unk what side)  - She is wanting you go back and look at the last couple of x-rays and see if the spot is there or is it a new one.   Dr. Yetta Barre - Neurosurgeon    Home [phone]

## 2012-12-10 ENCOUNTER — Other Ambulatory Visit: Payer: BC Managed Care – PPO

## 2012-12-10 ENCOUNTER — Other Ambulatory Visit (HOSPITAL_COMMUNITY): Payer: BC Managed Care – PPO

## 2012-12-11 ENCOUNTER — Other Ambulatory Visit: Payer: BC Managed Care – PPO

## 2012-12-18 ENCOUNTER — Ambulatory Visit: Payer: BC Managed Care – PPO | Admitting: Family Medicine

## 2013-01-23 ENCOUNTER — Other Ambulatory Visit: Payer: Self-pay | Admitting: Family Medicine

## 2013-01-23 DIAGNOSIS — Z1231 Encounter for screening mammogram for malignant neoplasm of breast: Secondary | ICD-10-CM

## 2013-02-01 ENCOUNTER — Other Ambulatory Visit: Payer: Self-pay | Admitting: *Deleted

## 2013-02-01 DIAGNOSIS — R5381 Other malaise: Secondary | ICD-10-CM

## 2013-02-01 DIAGNOSIS — E559 Vitamin D deficiency, unspecified: Secondary | ICD-10-CM

## 2013-02-01 DIAGNOSIS — R5383 Other fatigue: Secondary | ICD-10-CM

## 2013-02-04 ENCOUNTER — Other Ambulatory Visit: Payer: BC Managed Care – PPO

## 2013-02-05 ENCOUNTER — Other Ambulatory Visit: Payer: BC Managed Care – PPO

## 2013-02-05 LAB — CBC WITH DIFFERENTIAL/PLATELET
Basophils Absolute: 0 10*3/uL (ref 0.0–0.1)
Basophils Relative: 0 % (ref 0–1)
Eosinophils Absolute: 0.2 10*3/uL (ref 0.0–0.7)
Eosinophils Relative: 3 % (ref 0–5)
HCT: 37.7 % (ref 36.0–46.0)
Hemoglobin: 13.2 g/dL (ref 12.0–15.0)
Lymphocytes Relative: 23 % (ref 12–46)
Lymphs Abs: 1.7 10*3/uL (ref 0.7–4.0)
MCH: 30 pg (ref 26.0–34.0)
MCHC: 35 g/dL (ref 30.0–36.0)
MCV: 85.7 fL (ref 78.0–100.0)
Monocytes Absolute: 0.7 10*3/uL (ref 0.1–1.0)
Monocytes Relative: 10 % (ref 3–12)
Neutro Abs: 4.7 10*3/uL (ref 1.7–7.7)
Neutrophils Relative %: 64 % (ref 43–77)
Platelets: 439 10*3/uL — ABNORMAL HIGH (ref 150–400)
RBC: 4.4 MIL/uL (ref 3.87–5.11)
RDW: 13.7 % (ref 11.5–15.5)
WBC: 7.3 10*3/uL (ref 4.0–10.5)

## 2013-02-06 LAB — VITAMIN D 25 HYDROXY (VIT D DEFICIENCY, FRACTURES): Vit D, 25-Hydroxy: 42 ng/mL (ref 30–89)

## 2013-02-07 ENCOUNTER — Ambulatory Visit: Payer: BC Managed Care – PPO | Admitting: Family Medicine

## 2013-02-08 ENCOUNTER — Encounter: Payer: Self-pay | Admitting: Family Medicine

## 2013-02-08 ENCOUNTER — Ambulatory Visit (INDEPENDENT_AMBULATORY_CARE_PROVIDER_SITE_OTHER): Payer: BC Managed Care – PPO | Admitting: Family Medicine

## 2013-02-08 VITALS — BP 114/79 | HR 69 | Resp 16 | Wt 196.0 lb

## 2013-02-08 DIAGNOSIS — E669 Obesity, unspecified: Secondary | ICD-10-CM

## 2013-02-08 DIAGNOSIS — Z111 Encounter for screening for respiratory tuberculosis: Secondary | ICD-10-CM

## 2013-02-08 DIAGNOSIS — R82998 Other abnormal findings in urine: Secondary | ICD-10-CM

## 2013-02-08 DIAGNOSIS — R8290 Unspecified abnormal findings in urine: Secondary | ICD-10-CM

## 2013-02-08 LAB — POCT URINALYSIS DIPSTICK
Bilirubin, UA: NEGATIVE
Blood, UA: NEGATIVE
Glucose, UA: NEGATIVE
Ketones, UA: NEGATIVE
Leukocytes, UA: NEGATIVE
Nitrite, UA: NEGATIVE
Protein, UA: NEGATIVE
Spec Grav, UA: >=1.03
Urobilinogen, UA: NEGATIVE
pH, UA: 5

## 2013-02-08 MED ORDER — PHENTERMINE-TOPIRAMATE ER 7.5-46 MG PO CP24: 1.0000 | ORAL_CAPSULE | Freq: Every day | ORAL | Status: DC

## 2013-02-08 NOTE — Progress Notes (Signed)
Subjective:    Patient ID: Katrina Huff, female    DOB: 1965/09/04, 47 y.o.   MRN: [id number]  HPI  Katrina Huff is here today to go over her most recent lab results and to discuss the conditions listed below:  1)  Vitamin D:  She is doing well on her Vitamin D3 5000 IU daily.    2)  Weight:  She is currently taking Qsymia (7.5-46 mg).  She stopped this medication after her back surgery.  She has gained more weight and would like to go back on the Qsymia.    3)  X-Ray:  A small granuloma was noted so she would like to be tested for TB.      4)  Urinary Complaints:  She has been noticing that her urine has had a foul odor lately and she wants to be sure that she does not have an infection.     Review of Systems  Constitutional: Positive for unexpected weight change.  Respiratory: Negative for cough, chest tightness and shortness of breath.   Genitourinary:       Foul smelling urine  Musculoskeletal: Negative for back pain.  All other systems reviewed and are negative.     Past Medical History  Diagnosis Date  . LGSIL (low grade squamous intraepithelial dysplasia) 10/2007  . Disc disorder     L4,L5  . Anxiety   . Mental disorder   . Depression      Past Surgical History  Procedure Laterality Date  . Colposcopy    . Mirena      Inserted 12/2009  . Maximum access (mas)posterior lumbar interbody fusion (plif) 1 level N/A 12/04/2012    Procedure: Lumbar four-five Maximum Access Surgery Posterior Lumbar Interbody Fusion;  Surgeon: Tia Alert, MD;  Location: [facility];  Service: Neurosurgery;  Laterality: N/A;  Lumbar four-five Maximum Access Surgery Posterior Lumbar Interbody Fusion     History   Social History Narrative   Marital Status: Divorced    Children:  Sons (2), Daughter (1)   Pets: Dog/Cat /Fish   Living Situation: Lives with children and roommate.   Occupation: Futures trader    Education: Engineer, maintenance (IT) (Soci[employer])    Tobacco Use/Exposure:  None    Alcohol Use:  Occasional   Drug Use:  None   Diet:  Regular   Exercise:  Limited    Hobbies: Reading, Tennis, Writing     Family History  Problem Relation Age of Onset  . Coronary artery disease Other     x8 (uncles, cousins, grandmother).  . Diabetes Father   . Cancer Father     Carcinoid  . Breast cancer Mother     Age 18  . Breast cancer Maternal Grandmother     Onset postmenopausal  . Coronary artery disease Maternal Grandmother      Current Outpatient Prescriptions on File Prior to Visit  Medication Sig Dispense Refill  . Ascorbic Acid (VITAMIN C) 1000 MG tablet Take 1,000 mg by mouth daily.      . sertraline (ZOLOFT) 100 MG tablet Take 1.5 tablets (150 mg total) by mouth daily.  135 tablet  4   No current facility-administered medications on file prior to visit.     Allergies  Allergen Reactions  . Septra [Sulfamethoxazole-Tmp Ds] Rash    Mild rash     Immunization History  Administered Date(s) Administered  . Influenza,inj,Quad PF,36+ Mos 03/01/2012, 12/05/2012  . PPD Test 02/08/2013  . Tdap 08/31/2007  Objective:   Physical Exam  Constitutional: She appears well-nourished. No distress.  HENT:  Head: Normocephalic.  Eyes: No scleral icterus.  Neck: No thyromegaly present.  Cardiovascular: Normal rate, regular rhythm and normal heart sounds.   Pulmonary/Chest: Effort normal and breath sounds normal.  Abdominal: There is no tenderness.  Musculoskeletal: She exhibits no edema and no tenderness.  Neurological: She is alert.  Skin: Skin is warm and dry.  Psychiatric: She has a normal mood and affect. Her behavior is normal. Judgment and thought content normal.      Assessment & Plan:    Katrina Huff was seen today for lab results, foul smelling urine and obesity.  Diagnoses and associated orders for this visit:  Foul smelling urine - POCT urinalysis dipstick  Obesity, unspecified - Phentermine-Topiramate (QSYMIA) 7.5-46 MG CP24; Take 1 tablet by  mouth daily.  Screening examination for pulmonary tuberculosis - PPD

## 2013-02-08 NOTE — Assessment & Plan Note (Signed)
Tuberculin skin test applied to her ventral left forearm. Explained how to read the test, measuring induration not just erythema; she was instructed to come back in 72 hours for her results.

## 2013-02-11 ENCOUNTER — Other Ambulatory Visit (INDEPENDENT_AMBULATORY_CARE_PROVIDER_SITE_OTHER): Payer: BC Managed Care – PPO | Admitting: *Deleted

## 2013-02-11 DIAGNOSIS — Z111 Encounter for screening for respiratory tuberculosis: Secondary | ICD-10-CM

## 2013-02-11 LAB — READ PPD: TB Skin Test: NEGATIVE

## 2013-02-11 NOTE — Progress Notes (Signed)
Katrina Huff is here to have her recent ppd read. She is feeling well with no reaction to her PPD placement

## 2013-02-25 DIAGNOSIS — R8290 Unspecified abnormal findings in urine: Secondary | ICD-10-CM | POA: Insufficient documentation

## 2013-03-07 ENCOUNTER — Ambulatory Visit (INDEPENDENT_AMBULATORY_CARE_PROVIDER_SITE_OTHER): Payer: BC Managed Care – PPO | Admitting: Gynecology

## 2013-03-07 ENCOUNTER — Encounter: Payer: Self-pay | Admitting: Gynecology

## 2013-03-07 VITALS — BP 120/70 | Ht 64.0 in | Wt 198.0 lb

## 2013-03-07 DIAGNOSIS — F411 Generalized anxiety disorder: Secondary | ICD-10-CM

## 2013-03-07 DIAGNOSIS — F419 Anxiety disorder, unspecified: Secondary | ICD-10-CM

## 2013-03-07 DIAGNOSIS — Z30431 Encounter for routine checking of intrauterine contraceptive device: Secondary | ICD-10-CM

## 2013-03-07 DIAGNOSIS — Z01419 Encounter for gynecological examination (general) (routine) without abnormal findings: Secondary | ICD-10-CM

## 2013-03-07 MED ORDER — SERTRALINE HCL 100 MG PO TABS: 150.0000 mg | ORAL_TABLET | Freq: Every day | ORAL | Status: DC

## 2013-03-07 MED ORDER — ALPRAZOLAM 0.25 MG PO TABS: 0.2500 mg | ORAL_TABLET | Freq: Every evening | ORAL | Status: DC | PRN

## 2013-03-07 NOTE — Patient Instructions (Signed)
Follow up in one year for annual exam 

## 2013-03-07 NOTE — Progress Notes (Signed)
Ayza [facility] October 18, 1965 [id number]        48 y.o.  [id number] for annual exam.  Doing well without complaints. Recently had back surgery.  Past medical history,surgical history, problem list, medications, allergies, family history and social history were all reviewed and documented in the EPIC chart.  ROS:  Performed and pertinent positives and negatives are included in the history, assessment and plan .  Exam: Kim assistant Filed Vitals:   03/07/13 1413  BP: 120/70  Height: 5\' 4"  (1.626 m)  Weight: 198 lb (89.812 kg)   General appearance  Normal Skin grossly normal Head/Neck normal with no cervical or supraclavicular adenopathy thyroid normal Lungs  clear Cardiac RR, without RMG Abdominal  soft, nontender, without masses, organomegaly or hernia Breasts  examined lying and sitting without masses, retractions, discharge or axillary adenopathy. Pelvic  Ext/BUS/vagina  Normal  Cervix  Normal with IUD string visualized  Uterus  anteverted, normal size, shape and contour, midline and mobile nontender   Adnexa  Without masses or tenderness    Anus and perineum  Normal   Rectovaginal  Normal sphincter tone without palpated masses or tenderness.    Assessment/Plan:  48 y.o. [id number] female for annual exam, amenorrheic, Mirena IUD.   1. Mirena IUD 12/2009. Doing well without menses. IUD string visualized. Continue to monitor. Due to be replaced 12/2014 2. Pap smear/HPV -02/2012. No Pap smear done today. History of LGSIL 2009. Negative Pap smears 2011/2014. Plan repeat Pap smear at 3 year interval. 3. Mammography 02/2012. Patient due now and will schedule. SBE monthly reviewed. History of prior galactorrhea 02/2012 now resolved. Prolactin was 16 4. Anxiety/depression. Patient on Zoloft 150 mg started years ago and doing well wants to continue. I refilled her x1 year. Does use one half of 0.25 mg Xanax when necessary anxiety. #30 with one refill prescription provided. 5. Health maintenance.  Patient reports routine blood work done through primary care office. The blood work done today. Followup one year, sooner as needed.   Note: This document was prepared with digital dictation and possible smart phrase technology. Any transcriptional errors that result from this process are unintentional.   Anastasio Auerbach MD, 3:05 PM 03/07/2013

## 2013-03-12 ENCOUNTER — Encounter: Payer: Self-pay | Admitting: Gynecology

## 2013-03-13 ENCOUNTER — Ambulatory Visit (HOSPITAL_COMMUNITY): Payer: BC Managed Care – PPO

## 2013-03-14 ENCOUNTER — Ambulatory Visit (HOSPITAL_COMMUNITY): Payer: BC Managed Care – PPO

## 2013-03-19 ENCOUNTER — Ambulatory Visit (HOSPITAL_COMMUNITY)
Admission: RE | Admit: 2013-03-19 | Discharge: 2013-03-19 | Disposition: A | Discharge status: Home / Self Care | Payer: BC Managed Care – PPO | Source: Ambulatory Visit | Attending: Family Medicine | Admitting: Family Medicine

## 2013-03-19 DIAGNOSIS — Z1231 Encounter for screening mammogram for malignant neoplasm of breast: Secondary | ICD-10-CM | POA: Insufficient documentation

## 2013-03-20 ENCOUNTER — Ambulatory Visit (HOSPITAL_COMMUNITY): Payer: BC Managed Care – PPO

## 2013-10-26 ENCOUNTER — Emergency Department (HOSPITAL_COMMUNITY): Payer: BC Managed Care – PPO

## 2013-10-26 ENCOUNTER — Encounter (HOSPITAL_COMMUNITY): Payer: Self-pay | Admitting: Emergency Medicine

## 2013-10-26 ENCOUNTER — Emergency Department (HOSPITAL_COMMUNITY)
Admission: EM | Admit: 2013-10-26 | Discharge: 2013-10-26 | Disposition: A | Discharge status: Home / Self Care | Payer: BC Managed Care – PPO | Attending: Emergency Medicine | Admitting: Emergency Medicine

## 2013-10-26 DIAGNOSIS — F411 Generalized anxiety disorder: Secondary | ICD-10-CM | POA: Insufficient documentation

## 2013-10-26 DIAGNOSIS — F3289 Other specified depressive episodes: Secondary | ICD-10-CM | POA: Insufficient documentation

## 2013-10-26 DIAGNOSIS — M6281 Muscle weakness (generalized): Secondary | ICD-10-CM | POA: Diagnosis present

## 2013-10-26 DIAGNOSIS — Z8739 Personal history of other diseases of the musculoskeletal system and connective tissue: Secondary | ICD-10-CM | POA: Insufficient documentation

## 2013-10-26 DIAGNOSIS — Z85828 Personal history of other malignant neoplasm of skin: Secondary | ICD-10-CM | POA: Diagnosis not present

## 2013-10-26 DIAGNOSIS — Z7982 Long term (current) use of aspirin: Secondary | ICD-10-CM | POA: Insufficient documentation

## 2013-10-26 DIAGNOSIS — F1410 Cocaine abuse, uncomplicated: Secondary | ICD-10-CM | POA: Diagnosis not present

## 2013-10-26 DIAGNOSIS — R209 Unspecified disturbances of skin sensation: Secondary | ICD-10-CM | POA: Diagnosis not present

## 2013-10-26 DIAGNOSIS — Z79899 Other long term (current) drug therapy: Secondary | ICD-10-CM | POA: Insufficient documentation

## 2013-10-26 DIAGNOSIS — R200 Anesthesia of skin: Secondary | ICD-10-CM

## 2013-10-26 DIAGNOSIS — F329 Major depressive disorder, single episode, unspecified: Secondary | ICD-10-CM | POA: Insufficient documentation

## 2013-10-26 DIAGNOSIS — H532 Diplopia: Secondary | ICD-10-CM

## 2013-10-26 LAB — APTT: aPTT: 30 seconds (ref 24–37)

## 2013-10-26 LAB — COMPREHENSIVE METABOLIC PANEL
ALBUMIN: 4.2 g/dL (ref 3.5–5.2)
ALK PHOS: 92 U/L (ref 39–117)
ALT: 20 U/L (ref 0–35)
AST: 21 U/L (ref 0–37)
Anion gap: 13 (ref 5–15)
BILIRUBIN TOTAL: 0.5 mg/dL (ref 0.3–1.2)
BUN: 16 mg/dL (ref 6–23)
CHLORIDE: 98 meq/L (ref 96–112)
CO2: 26 mEq/L (ref 19–32)
CREATININE: 0.77 mg/dL (ref 0.50–1.10)
Calcium: 9.5 mg/dL (ref 8.4–10.5)
GFR calc non Af Amer: >90 mL/min (ref >90)
Glucose, Bld: 94 mg/dL (ref 70–99)
POTASSIUM: 4.1 meq/L (ref 3.7–5.3)
Sodium: 137 mEq/L (ref 137–147)
Total Protein: 7.8 g/dL (ref 6.0–8.3)

## 2013-10-26 LAB — RAPID URINE DRUG SCREEN, HOSP PERFORMED
Amphetamines: NOT DETECTED
BARBITURATES: NOT DETECTED
Benzodiazepines: NOT DETECTED
Cocaine: POSITIVE — AB
Opiates: NOT DETECTED
TETRAHYDROCANNABINOL: NOT DETECTED

## 2013-10-26 LAB — CBC
HEMATOCRIT: 42.7 % (ref 36.0–46.0)
Hemoglobin: 14.3 g/dL (ref 12.0–15.0)
MCH: 30.2 pg (ref 26.0–34.0)
MCHC: 33.5 g/dL (ref 30.0–36.0)
MCV: 90.3 fL (ref 78.0–100.0)
Platelets: 393 10*3/uL (ref 150–400)
RBC: 4.73 MIL/uL (ref 3.87–5.11)
RDW: 13.3 % (ref 11.5–15.5)
WBC: 9.6 10*3/uL (ref 4.0–10.5)

## 2013-10-26 LAB — I-STAT TROPONIN, ED: TROPONIN I, POC: 0 ng/mL (ref 0.00–0.08)

## 2013-10-26 LAB — DIFFERENTIAL
BASOS PCT: 0 % (ref 0–1)
Basophils Absolute: 0 10*3/uL (ref 0.0–0.1)
EOS ABS: 0.2 10*3/uL (ref 0.0–0.7)
Eosinophils Relative: 2 % (ref 0–5)
Lymphocytes Relative: 23 % (ref 12–46)
Lymphs Abs: 2.2 10*3/uL (ref 0.7–4.0)
MONO ABS: 0.9 10*3/uL (ref 0.1–1.0)
Monocytes Relative: 9 % (ref 3–12)
NEUTROS ABS: 6.3 10*3/uL (ref 1.7–7.7)
Neutrophils Relative %: 66 % (ref 43–77)

## 2013-10-26 LAB — PROTIME-INR
INR: 0.95 (ref 0.00–1.49)
Prothrombin Time: 12.7 seconds (ref 11.6–15.2)

## 2013-10-26 LAB — CBG MONITORING, ED: Glucose-Capillary: 84 mg/dL (ref 70–99)

## 2013-10-26 MED ORDER — GADOBENATE DIMEGLUMINE 529 MG/ML IV SOLN
19.0000 mL | Freq: Once | INTRAVENOUS | Status: AC | PRN
  Administered 2013-10-26: 19 mL via INTRAVENOUS

## 2013-10-26 MED ORDER — LORAZEPAM 2 MG/ML IJ SOLN: 1.0000 mg | Freq: Once | INTRAMUSCULAR | Status: DC

## 2013-10-26 MED ORDER — IOHEXOL 350 MG/ML SOLN
100.0000 mL | Freq: Once | INTRAVENOUS | Status: AC | PRN
  Administered 2013-10-26: 100 mL via INTRAVENOUS

## 2013-10-26 MED ORDER — ASPIRIN 81 MG PO CHEW: 81.0000 mg | CHEWABLE_TABLET | Freq: Every day | ORAL | Status: DC

## 2013-10-26 MED ORDER — LORAZEPAM 2 MG/ML IJ SOLN
2.0000 mg | Freq: Once | INTRAMUSCULAR | Status: AC
  Administered 2013-10-26: 2 mg via INTRAVENOUS
  Filled 2013-10-26: qty 1

## 2013-10-26 MED ORDER — LORAZEPAM 2 MG/ML IJ SOLN: 2.0000 mg | Freq: Once | INTRAMUSCULAR | Status: DC

## 2013-10-26 NOTE — ED Provider Notes (Signed)
Patient care transferred to me. The patient's MRI initially did not do the arterial studies that were requested. Due to this a CT angiography was performed of head and neck showed a possible aneurysm on the left vertebral artery near C6. After discussion with the radiologist, Dr. Jeannine Kitten, an MRI will be repeated in a different view to evaluate the vertebral artery. His MRI showed no evidence of dissection. Due to this, combined with patient being asymptomatic, will discharge as previously discussed with Dr. Venora Maples. Patient will be restarted on low-dose aspirin has been given neurology followup as an outpatient.  Ephraim Hamburger, MD 10/26/13 [phone]

## 2013-10-26 NOTE — ED Notes (Signed)
MD Goldston at bedside  

## 2013-10-26 NOTE — ED Notes (Addendum)
Pt requesting to see the physician and requesting food. Pt informed that CT results need to be back in order for a decision to be made about food. MD Regenia Skeeter made aware.

## 2013-10-26 NOTE — ED Notes (Signed)
MD aware that patient and visitor  Would like to speak to him.

## 2013-10-26 NOTE — ED Notes (Signed)
Pt unable to tell if she is seeing double. Patient negative on NIH. She is alert and oriented but states "I'm so tired from that medicine".

## 2013-10-26 NOTE — ED Notes (Signed)
Patient ambulated to restroom with steady gait.

## 2013-10-26 NOTE — ED Notes (Signed)
She has just returned from MR and passed her stroke swallow screen.  She remains oriented x 4.

## 2013-10-26 NOTE — ED Provider Notes (Signed)
CSN: [id number]     Arrival date & time 10/26/13  3710 History   First MD Initiated Contact with Patient 10/26/13 1034     Chief Complaint  Patient presents with  . Diplopia      HPI Patient states she awoke this morning and felt as though the left side of her face was numb.  She's had some diplopia of her right thigh as well.  He was noted last night that she developed some slurred speech although this was not noticeable to her but noticeable to her friend on the phone.  Her friend is a retired Engineer, drilling.  She denies ataxia.  No weakness of her arms or legs.  No prior history of stroke.  Denies injury.  She does report mild left-sided neck discomfort as well.  Denies headache at this time.  Symptoms are mild in severity.  She feels as though her double vision has resolved   Past Medical History  Diagnosis Date  . LGSIL (low grade squamous intraepithelial dysplasia) 10/2007  . Disc disorder     L4,L5  . Anxiety   . Mental disorder   . Depression   . Basal cell carcinoma    Past Surgical History  Procedure Laterality Date  . Colposcopy    . Mirena      Inserted 12/2009  . Maximum access (mas)posterior lumbar interbody fusion (plif) 1 level N/A 12/04/2012    Procedure: Lumbar four-five Maximum Access Surgery Posterior Lumbar Interbody Fusion;  Surgeon: Eustace Moore, MD;  Location: [facility];  Service: Neurosurgery;  Laterality: N/A;  Lumbar four-five Maximum Access Surgery Posterior Lumbar Interbody Fusion   Family History  Problem Relation Age of Onset  . Coronary artery disease Other     x8 (uncles, cousins, grandmother).  . Diabetes Father   . Cancer Father     Carcinoid  . Breast cancer Mother     Age 87  . Breast cancer Maternal Grandmother     Onset postmenopausal  . Coronary artery disease Maternal Grandmother    History  Substance Use Topics  . Smoking status: Never Smoker   . Smokeless tobacco: Never Used  . Alcohol Use: 0.6 oz/week    1 Glasses of wine per  week     Comment: occasional   OB History   Grav Para Term Preterm Abortions TAB SAB Ect Mult Living   3 3 3       3      Review of Systems  All other systems reviewed and are negative.     Allergies  Septra  Home Medications   Prior to Admission medications   Medication Sig Start Date End Date Taking? Authorizing Provider  Ascorbic Acid (VITAMIN C) 1000 MG tablet Take 1,000 mg by mouth daily.   Yes Historical Provider, MD  aspirin EC 81 MG tablet Take 81 mg by mouth daily.   Yes Historical Provider, MD  Cholecalciferol (VITAMIN D3) 5000 UNITS CAPS Take 1 capsule by mouth.   Yes Historical Provider, MD  sertraline (ZOLOFT) 100 MG tablet Take 1.5 tablets (150 mg total) by mouth daily. 03/07/13  Yes Timothy P Fontaine, MD   BP 118/79  Pulse 46  Resp 16  SpO2 100% Physical Exam  Nursing note and vitals reviewed. Constitutional: She is oriented to person, place, and time. She appears well-developed and well-nourished. No distress.  HENT:  Head: Normocephalic and atraumatic.  No carotid bruits noted on the left or right  Eyes: EOM are normal. Pupils  are equal, round, and reactive to light.  Neck: Normal range of motion.  Cardiovascular: Normal rate, regular rhythm and normal heart sounds.   Pulmonary/Chest: Effort normal and breath sounds normal.  Abdominal: Soft. She exhibits no distension. There is no tenderness.  Musculoskeletal: Normal range of motion.  Neurological: She is alert and oriented to person, place, and time.  5/5 strength in major muscle groups of  bilateral upper and lower extremities. Speech normal. No facial asymetry.  Normal finger to nose bilaterally  Skin: Skin is warm and dry.  Psychiatric: She has a normal mood and affect. Judgment normal.    ED Course  Procedures (including critical care time) Labs Review Labs Reviewed  PROTIME-INR  APTT  CBC  DIFFERENTIAL  COMPREHENSIVE METABOLIC PANEL  URINE RAPID DRUG SCREEN (HOSP PERFORMED)  CBG  MONITORING, ED  I-STAT TROPOININ, ED    Imaging Review Ct Head Wo Contrast  10/26/2013   CLINICAL DATA:  Facial numbness  EXAM: CT HEAD WITHOUT CONTRAST  TECHNIQUE: Contiguous axial images were obtained from the base of the skull through the vertex without intravenous contrast.  COMPARISON:  None.  FINDINGS: Brain parenchyma and ventricular system are within normal limits. No mass effect, midline shift, or acute hemorrhage. Mastoid air cells are clear. No fracture.  IMPRESSION: Negative head CT.   Electronically Signed   By: Maryclare Bean M.D.   On: 10/26/2013 10:37     EKG Interpretation   Date/Time:  Saturday October 26 2013 10:07:27 EDT Ventricular Rate:  53 PR Interval:  130 QRS Duration: 92 QT Interval:  485 QTC Calculation: 455 R Axis:   -19 Text Interpretation:  Sinus rhythm Borderline left axis deviation No  significant change was found Confirmed by Chett Taniguchi  MD, Athenia Rys ([id number]) on  10/26/2013 11:00:31 AM      MDM   Final diagnoses:  None    left-sided neck pain this could represent dissection.  Given her neurologic symptoms MRI scan will be performed.  At this time it seems that her diplopia has resolved.  This could represent TIA as well.  4:31 PM Unfortunately there is motion degraded artifact which somewhat limits the evaluation on MRI.  There is also issues with the timing for dissection.  Given the recommendation of the neuroradiologist will undergo CT AG head and neck to evaluate for dissection.  Patient is feeling better.  Likely arterial spasm versus TIA.  If her CT angiogram without significant abnormality the patient can be discharged safely home to the emergency department.  Instructed that the patient should followup with the neurologist as an outpatient.  She restarted on 81 mg aspirin. Care to Dr Regenia Skeeter   Hoy Morn, MD 10/26/13 [phone]

## 2013-10-26 NOTE — ED Notes (Signed)
She awoke to find the left side of her face to feel "numb".  She proceeded to fold laundry and perform some other household chores; and when she e-mailed her mother, she noted herself to have "double vision", both of which persist, and she has very mild slurring of speech.  She ambulated without ataxia, nor any other difficulty (per her insistence) to our E.D. Room.  I notified Dr. Tawnya Crook on her arrival and she gave initial order for CT scan.  She is alert and oriented x 4.  Her teenage daughter is with her.  She states she spoke with an ophthalmologist, who advised her to phone EMS, however, she chose rather to drive here.  My efforts thus far have been to procure CT and to start IV/lab draw.  As I write this, she has just returned from CT.

## 2013-10-26 NOTE — ED Notes (Addendum)
Pt arguing with daughter, over who she can talk too, or tell about her current situation, pt stating she is agitated with daughter.  Pt asking daughter to lie to children and family members, daughter is concerned and telling pt she is being unreasonable and she will let her aunt know.  Pt also stated that she came to the wrong hospital, she wanted to be at [facility] and has heard to never come here.

## 2013-10-26 NOTE — ED Notes (Signed)
Pt remains in MR

## 2013-10-29 ENCOUNTER — Encounter: Payer: Self-pay | Admitting: Neurology

## 2013-10-29 ENCOUNTER — Ambulatory Visit (INDEPENDENT_AMBULATORY_CARE_PROVIDER_SITE_OTHER): Payer: BC Managed Care – PPO | Admitting: Neurology

## 2013-10-29 VITALS — BP 116/74 | HR 65 | Ht 64.1 in | Wt 203.6 lb

## 2013-10-29 DIAGNOSIS — R209 Unspecified disturbances of skin sensation: Secondary | ICD-10-CM

## 2013-10-29 DIAGNOSIS — R200 Anesthesia of skin: Secondary | ICD-10-CM | POA: Insufficient documentation

## 2013-10-29 NOTE — Progress Notes (Signed)
NEUROLOGY CLINIC NEW PATIENT NOTE  NAME: Katrina Huff DOB: May 09, 1965  I saw Katrina Huff as a new patient in the neurovascular clinic today regarding  Chief Complaint  Patient presents with  . Diplopia    np # 1  .  HPI: Katrina Huff is a 48 y.o. female with PMH of LBP and basal cell carcinoma at left eyelid s/p resection who presents as a new patient for episode of diplopia, left face numbness, and slurry speech.   Pt stated that last Friday night, she was tired and was talking over the phone to her friend. Her friend is a Psychologist, sport and exercise and questioned whether she had slurry speech and asked if she was drunk, but pt did not think she had any symptoms of slurry speech. Saturday morning, she woke up and sitting in front of computer seeing double on the screen. She closed on her left eye, she still has diplopia. When she close on the right eye, left eye is blurry which is her baseline. She also felt left face numbness at the time, she called her sister, and talked with her eye doctor who recommended her to dall 911, she declined but drove to ER, had extensive work up with two MRIs and two CTs, and did not show acute stroke or vascular abnormalities. Her symptoms largely resolved except left face pain numbness comes and goes. she was discharged from ER later that day.  Sunday she woke up without double vision, speech is fine, she went out with her friend and was told speech slightly sluggish. But still has left face numbness comes and goes until now. She denies any headache, weakness, LOC, or vision changes.  Had [place] at left eyelid had biopsy on 02/20/13 and resected in Jan. No recurrence so far.  No smoking cigarettes, but smoked marajuna two weeks ago. Alcohol 1-3 beers a week.    Past Medical History  Diagnosis Date  . LGSIL (low grade squamous intraepithelial dysplasia) 10/2007  . Disc disorder     L4,L5  . Anxiety   . Mental disorder   . Depression   . Basal cell carcinoma     Past Surgical History  Procedure Laterality Date  . Colposcopy    . Mirena      Inserted 12/2009  . Maximum access (mas)posterior lumbar interbody fusion (plif) 1 level N/A 12/04/2012    Procedure: Lumbar four-five Maximum Access Surgery Posterior Lumbar Interbody Fusion;  Surgeon: Eustace Moore, MD;  Location: [place] [facility];  Service: Neurosurgery;  Laterality: N/A;  Lumbar four-five Maximum Access Surgery Posterior Lumbar Interbody Fusion   Family History  Problem Relation Age of Onset  . Coronary artery disease Other     x8 (uncles, cousins, grandmother).  . Diabetes Father   . Cancer Father     Carcinoid  . Breast cancer Mother     Age 63  . Breast cancer Maternal Grandmother     Onset postmenopausal  . Coronary artery disease Maternal Grandmother    Current Outpatient Prescriptions  Medication Sig Dispense Refill  . Ascorbic Acid (VITAMIN C) 1000 MG tablet Take 1,000 mg by mouth daily.      Marland Kitchen aspirin EC 81 MG tablet Take 81 mg by mouth daily.      . Cholecalciferol (VITAMIN D3) 5000 UNITS CAPS Take 1 capsule by mouth.      . sertraline (ZOLOFT) 100 MG tablet Take 1.5 tablets (150 mg total) by mouth daily.  135 tablet  4  No current facility-administered medications for this visit.   Allergies  Allergen Reactions  . Septra [Sulfamethoxazole-Tmp Ds] Rash    Mild rash   History   Social History  . Marital Status: Divorced    Spouse Name: N/A    Number of Children: 3  . Years of Education: COLLEGE    Occupational History  .      Mother  .  Other   Social History Main Topics  . Smoking status: Never Smoker   . Smokeless tobacco: Never Used  . Alcohol Use: 0.6 oz/week    1 Glasses of wine per week     Comment: occasional  . Drug Use: Yes    Special: Marijuana  . Sexual Activity: Yes    Partners: Male    Birth Control/ Protection: IUD     Comment: Mirena inserted 12/2009   Other Topics Concern  . Not on file   Social History Narrative   Marital  Status: Divorced    Children:  Sons (2), Daughter (1)   Pets: Dog/Cat /Fish   Living Situation: Lives with children and roommate.   Occupation: Agricultural engineer    Education: Forensic psychologist (Sociology)    Tobacco Use/Exposure:  None    Alcohol Use:  Occasional   Drug Use:  None   Diet:  Regular   Exercise:  Limited    Hobbies: Reading, Tennis, Writing    Review of Systems Full 14 system review of systems performed and notable only for those listed, all others are neg:  Constitutional: N/A  Cardiovascular: N/A  Ear/Nose/Throat: N/A  Skin: N/A  Eyes: double vision  Respiratory: N/A  Gastroitestinal: N/A  Hematology/Lymphatic: N/A  Endocrine: N/A  Musculoskeletal: N/A  Allergy/Immunology: N/A  Neurological: numbness  Psychiatric: N/A   Physical Exam  Filed Vitals:   10/29/13 1041  BP: 116/74  Pulse: 65    General - Well nourished, well developed, in no apparent distress.  Ophthalmologic - Sharp disc margins OU.  Cardiovascular - Regular rate and rhythm with no murmur. Carotid pulses were 2+ without bruits .   Neck - supple, no nuchal rigidity .  Mental Status -  Level of arousal and orientation to time, place, and person were intact. Language including expression, naming, repetition, comprehension was assessed and found intact. Attention span and concentration were normal. Recent and remote memory were intact. Fund of Knowledge was assessed and was intact.  Cranial Nerves II - XII - II - Visual field intact OU. III, IV, VI - Extraocular movements intact. V - Facial sensation decreased at V2 distribution on the left. VII - Facial movement intact bilaterally. VIII - Hearing & vestibular intact bilaterally. X - Palate elevates symmetrically. XI - Chin turning & shoulder shrug intact bilaterally. XII - Tongue protrusion intact.  Motor Strength - The patient's strength was normal in all extremities and pronator drift was absent.  Bulk was normal and fasciculations  were absent.   Motor Tone - Muscle tone was assessed at the neck and appendages and was normal.  Reflexes - The patient's reflexes were normal in all extremities and she had no pathological reflexes.  Sensory - Light touch, temperature/pinprick, vibration and proprioception, and Romberg testing were assessed and were normal.    Coordination - The patient had normal movements in the hands and feet with no ataxia or dysmetria.  Tremor was absent.  Gait and Station - The patient's transfers, posture, gait, station, and turns were observed as normal.   Imaging  MRI HEAD  10/26/13:  Some of the sequences are motion degraded. Patient refused further  imaging.  No acute infarct.  Mild nonspecific white matter type changes as noted above.  Mild cervical spondylotic changes C3-4 with mild spinal stenosis.  MRA HEAD:  Exam is motion degraded.  Anterior circulation without medium or large size vessel significant  stenosis or occlusion.  Codominant vertebral arteries without high-grade stenosis.  Only small portion of the right posterior inferior cerebellar artery  is visualized.  Nonvisualized left posterior inferior cerebellar artery.  Posterior cerebral artery distal branch vessel irregularity.  MRA NECK  Secondary to motion, exam is limited. This particularly limits the  proximal aspect of the great vessels as well as the carotid  bifurcation bilaterally. All that can be stated with certainty on  the present examination is that there is flow within portions of the  internal carotid arteries and vertebral arteries bilaterally. The  possibility of stenosis, dissection or fibromuscular dysplasia  cannot be addressed on the present exam. If further delineation is  clinically desired CT angiogram would be necessary.  Patient returned for T1 weighted imaging. No vertebral artery  dissection is visualized.  CTA HEAD :  No medium or large size intracranial arterial vessel significant   stenosis or occlusion.  Ectatic vessels at the anterior communicating artery region without  aneurysm identified.  White matter type changes seen on recent MR not as well appreciated  on present CT.  Review of the MIP images confirms the above findings.  CTA NECK :  Left vertebral artery at the C6 level changes caliber slightly.  Although this may represent a normal finding, subtle dissection  cannot be entirely excluded. Fat-suppressed T1 weighted imaging  through this level can be obtained for further delineation  No evidence of carotid dissection or significant stenosis. Mild  ectasia greater on the left.  No evidence of right vertebral artery dissection or abnormality.  Cervical spondylotic changes greater on the right at the C3-4 level,  C4-5 level and C5-6 level. Mild cervical kyphosis centered at C4-5  level.  Lab Review none  Assessments: NIHSS: 1 Rankin: 1   Assessment and Plan:   In summary, Marjory Meints is a 48 y.o. female with PMH of [place] presents to ED this weekend for an episode of diplopia, slurry speech and left facial numbness. However, the diplopia is monocular at right side, did not fit to neurological deficit. Her left facial numbness comes and goes, but only at V2 distribution, not consistent with stroke like symptoms. Slurry speech is sketchy as pt did not feel any speech problem and the friend only stated slightly sluggish. The whole story did not fit to stroke like symptoms and I have low suspicious for stroke or TIA. DDx include complicated migraine or seizure or stress related. Pt had no hx of migraine, and it is unusual for her to start with migraine at this age with complicated migraine without headache. Also has low suspicious for seizure but pt prefer to have EEG done to rule out abnormal discharge.   - EEG to rule out seizure like activity - continue ASA 81mg  for stroke prevention - follow up PRN.   Thank you very much for the opportunity to  participate in the care of this patient.  Please do not hesitate to call if any questions or concerns arise.  Orders Placed This Encounter  Procedures  . EEG adult    Standing Status: Future     Number of Occurrences:  Standing Expiration Date: 10/30/2014    Order Specific Question:  Where should this test be performed?    Answer:  [facility]    Patient Instructions  - EEG to rule out seizure. Will let you know the result as soon as it is available.  - low suspicious for stroke like symptoms - continue ASA 81 for stroke prevention. - follow up in clinic PRN    Rosalin Hawking, MD PhD [facility] [address], [place], [place] [address] [phone]

## 2013-10-29 NOTE — Patient Instructions (Signed)
-   EEG to rule out seizure. Will let you know the result as soon as it is available.  - low suspicious for stroke like symptoms - continue ASA 81 for stroke prevention. - follow up in clinic PRN

## 2013-10-31 ENCOUNTER — Other Ambulatory Visit: Payer: BC Managed Care – PPO

## 2013-10-31 ENCOUNTER — Other Ambulatory Visit: Payer: BC Managed Care – PPO | Admitting: Radiology

## 2013-11-07 ENCOUNTER — Ambulatory Visit (INDEPENDENT_AMBULATORY_CARE_PROVIDER_SITE_OTHER): Payer: BC Managed Care – PPO

## 2013-11-07 DIAGNOSIS — R209 Unspecified disturbances of skin sensation: Secondary | ICD-10-CM

## 2013-11-07 DIAGNOSIS — R200 Anesthesia of skin: Secondary | ICD-10-CM

## 2013-11-07 NOTE — Procedures (Signed)
    History:  Katrina Huff is a 48 year old patient with a history of double vision, left facial numbness, and slurred speech. She had a basal cell carcinoma resected from the left eyelid. The patient had a recent episode of double vision and slurred speech. The patient is being evaluated for this issue.  This is a routine EEG. No skull defects are noted. Medications include vitamin C, aspirin, vitamin D, and Zoloft.   EEG classification: Normal awake and asleep  Description of the recording: The background rhythms of this recording consists of a fairly well modulated medium amplitude background activity of 9 Hz. As the record progresses, the patient initially is in the waking state, but appears to enter the early stage II sleep during the recording, with rudimentary sleep spindles and vertex sharp wave activity seen. During the wakeful state, photic stimulation was not performed. Hyperventilation was performed, and this results in a minimal buildup of the background rhythm activities without significant slowing seen. At no time during the recording does there appear to be evidence of spike or spike wave discharges or evidence of focal slowing. EKG monitor shows no evidence of cardiac rhythm abnormalities with a heart rate of 56.  Impression: This is a normal EEG recording in the waking and sleeping state. No evidence of ictal or interictal discharges were seen at any time during the recording.

## 2013-11-11 ENCOUNTER — Telehealth: Payer: Self-pay | Admitting: *Deleted

## 2013-11-11 NOTE — Progress Notes (Signed)
Quick Note:  Pt received note that EEG normal from [employer], [place]. ______

## 2013-11-11 NOTE — Telephone Encounter (Signed)
LMVM for pt to return call for results of test done in the office.

## 2013-11-11 NOTE — Telephone Encounter (Signed)
Called patient and gave normal results she showed understanding.

## 2013-11-11 NOTE — Telephone Encounter (Signed)
EEG normal results given to pt by myself as well as Hart Carwin, NA.

## 2013-11-19 ENCOUNTER — Encounter: Payer: Self-pay | Admitting: Physician Assistant

## 2013-12-23 ENCOUNTER — Encounter: Payer: Self-pay | Admitting: Neurology

## 2014-01-09 ENCOUNTER — Telehealth: Payer: Self-pay | Admitting: *Deleted

## 2014-01-09 NOTE — Telephone Encounter (Signed)
Pt asked date for Mirena IUD replacement date which is 12/2014.

## 2014-02-27 ENCOUNTER — Other Ambulatory Visit: Payer: Self-pay | Admitting: Obstetrics and Gynecology

## 2014-02-27 ENCOUNTER — Other Ambulatory Visit: Payer: Self-pay | Admitting: Gynecology

## 2014-02-27 DIAGNOSIS — Z1231 Encounter for screening mammogram for malignant neoplasm of breast: Secondary | ICD-10-CM

## 2014-03-17 ENCOUNTER — Encounter: Payer: Self-pay | Admitting: Gynecology

## 2014-03-17 ENCOUNTER — Ambulatory Visit (INDEPENDENT_AMBULATORY_CARE_PROVIDER_SITE_OTHER): Payer: BLUE CROSS/BLUE SHIELD | Admitting: Gynecology

## 2014-03-17 VITALS — BP 114/70 | Ht 64.0 in | Wt 207.0 lb

## 2014-03-17 DIAGNOSIS — Z01419 Encounter for gynecological examination (general) (routine) without abnormal findings: Secondary | ICD-10-CM

## 2014-03-17 DIAGNOSIS — Z30431 Encounter for routine checking of intrauterine contraceptive device: Secondary | ICD-10-CM

## 2014-03-17 DIAGNOSIS — F419 Anxiety disorder, unspecified: Secondary | ICD-10-CM

## 2014-03-17 LAB — CBC WITH DIFFERENTIAL/PLATELET
BASOS PCT: 0 % (ref 0–1)
Basophils Absolute: 0 10*3/uL (ref 0.0–0.1)
Eosinophils Absolute: 0.2 10*3/uL (ref 0.0–0.7)
Eosinophils Relative: 3 % (ref 0–5)
HCT: 36.5 % (ref 36.0–46.0)
HEMOGLOBIN: 12 g/dL (ref 12.0–15.0)
Lymphocytes Relative: 24 % (ref 12–46)
Lymphs Abs: 1.9 10*3/uL (ref 0.7–4.0)
MCH: 28.3 pg (ref 26.0–34.0)
MCHC: 32.9 g/dL (ref 30.0–36.0)
MCV: 86.1 fL (ref 78.0–100.0)
MONO ABS: 0.6 10*3/uL (ref 0.1–1.0)
MPV: 10.8 fL (ref 8.6–12.4)
Monocytes Relative: 8 % (ref 3–12)
Neutro Abs: 5.1 10*3/uL (ref 1.7–7.7)
Neutrophils Relative %: 65 % (ref 43–77)
PLATELETS: 377 10*3/uL (ref 150–400)
RBC: 4.24 MIL/uL (ref 3.87–5.11)
RDW: 14 % (ref 11.5–15.5)
WBC: 7.8 10*3/uL (ref 4.0–10.5)

## 2014-03-17 LAB — LIPID PANEL
Cholesterol: 202 mg/dL — ABNORMAL HIGH (ref 0–200)
HDL: 56 mg/dL (ref >39)
LDL CALC: 126 mg/dL — AB (ref 0–99)
Total CHOL/HDL Ratio: 3.6 Ratio
Triglycerides: 102 mg/dL (ref <150)
VLDL: 20 mg/dL (ref 0–40)

## 2014-03-17 LAB — COMPREHENSIVE METABOLIC PANEL
ALT: 14 U/L (ref 0–35)
AST: 17 U/L (ref 0–37)
Albumin: 4 g/dL (ref 3.5–5.2)
Alkaline Phosphatase: 73 U/L (ref 39–117)
BUN: 17 mg/dL (ref 6–23)
CHLORIDE: 111 meq/L (ref 96–112)
CO2: 25 mEq/L (ref 19–32)
Calcium: 9.1 mg/dL (ref 8.4–10.5)
Creat: 0.72 mg/dL (ref 0.50–1.10)
Glucose, Bld: 72 mg/dL (ref 70–99)
Potassium: 4.5 mEq/L (ref 3.5–5.3)
Sodium: 146 mEq/L — ABNORMAL HIGH (ref 135–145)
Total Bilirubin: 0.3 mg/dL (ref 0.2–1.2)
Total Protein: 6.8 g/dL (ref 6.0–8.3)

## 2014-03-17 MED ORDER — SERTRALINE HCL 100 MG PO TABS: 150.0000 mg | ORAL_TABLET | Freq: Every day | ORAL | Status: AC

## 2014-03-17 NOTE — Progress Notes (Signed)
Katrina Huff June 09, 1965 [id number]        49 y.o.  G3P3003 for annual exam.  Several issues noted below.  Past medical history,surgical history, problem list, medications, allergies, family history and social history were all reviewed and documented as reviewed in the EPIC chart.  ROS:  Performed with pertinent positives and negatives included in the history, assessment and plan.   Additional significant findings :  none   Exam: Kim Counsellor Vitals:   03/17/14 1141  BP: 114/70  Height: 5\' 4"  (1.626 m)  Weight: 207 lb (93.895 kg)   General appearance:  Normal affect, orientation and appearance. Skin: Grossly normal HEENT: Without gross lesions.  No cervical or supraclavicular adenopathy. Thyroid normal.  Lungs:  Clear without wheezing, rales or rhonchi Cardiac: RR, without RMG Abdominal:  Soft, nontender, without masses, guarding, rebound, organomegaly or hernia Breasts:  Examined lying and sitting without masses, retractions, discharge or axillary adenopathy. Pelvic:  Ext/BUS/vagina normal  Cervix normal. IUD string visualized  Uterus anteverted, normal size, shape and contour, midline and mobile nontender   Adnexa  Without masses or tenderness    Anus and perineum  Normal   Rectovaginal  Normal sphincter tone without palpated masses or tenderness.    Assessment/Plan:  49 y.o. G20P3003 female for annual exam without menses, Mirena IUD.   1. Mirena IUD 12/2009.  Doing well without menses. Due to have her IUD replaced this coming November and I reminded her and she is going to schedule that appointment. 2. Pap smear/HPV negative 2014. No Pap smear done today. History of LGSIL 2009 with normal Pap smears since then. Repeat Pap smear at 3-5 year interval. 3. Mammography scheduled in February and she knows to follow up for this. She reports feeling a small nodule in her right breast within the past 2 months. Had ultrasound study historically in [place] through another  physician which was negative. The patient no longer is able to feel any abnormalities on self-exam. Her exam today is normal. Recommend follow up for her already scheduled 3-D mammogram. Continue SBE and report any palpable abnormalities. 4. Anxiety. Patient on Zoloft 150 mg daily. Doing well with this without reported side effects. Desires to continue. I did discuss decreasing her dose to 100 mg to see how she does with this. Refill 1 year provided for the 150 mg dose. 5. Health maintenance. Baseline CBC, has a metabolic panel lipid profile urinalysis ordered. Follow up for IUD replacement November 2016 otherwise annual exam in one year.     Anastasio Auerbach MD, 12:11 PM 03/17/2014

## 2014-03-17 NOTE — Patient Instructions (Addendum)
Follow up for your mammogram as scheduled.  Follow up this coming fall for replacement of your Mirena IUD  You may obtain a copy of any labs that were done today by logging onto MyChart as outlined in the instructions provided with your AVS (after visit summary). The office will not call with normal lab results but certainly if there are any significant abnormalities then we will contact you.   Health Maintenance, Female A healthy lifestyle and preventative care can promote health and wellness.  Maintain regular health, dental, and eye exams.  Eat a healthy diet. Foods like vegetables, fruits, whole grains, low-fat dairy products, and lean protein foods contain the nutrients you need without too many calories. Decrease your intake of foods high in solid fats, added sugars, and salt. Get information about a proper diet from your caregiver, if necessary.  Regular physical exercise is one of the most important things you can do for your health. Most adults should get at least 150 minutes of moderate-intensity exercise (any activity that increases your heart rate and causes you to sweat) each week. In addition, most adults need muscle-strengthening exercises on 2 or more days a week.   Maintain a healthy weight. The body mass index (BMI) is a screening tool to identify possible weight problems. It provides an estimate of body fat based on height and weight. Your caregiver can help determine your BMI, and can help you achieve or maintain a healthy weight. For adults 20 years and older:  A BMI below 18.5 is considered underweight.  A BMI of 18.5 to 24.9 is normal.  A BMI of 25 to 29.9 is considered overweight.  A BMI of 30 and above is considered obese.  Maintain normal blood lipids and cholesterol by exercising and minimizing your intake of saturated fat. Eat a balanced diet with plenty of fruits and vegetables. Blood tests for lipids and cholesterol should begin at age 63 and be repeated every  5 years. If your lipid or cholesterol levels are high, you are over 50, or you are a high risk for heart disease, you may need your cholesterol levels checked more frequently.Ongoing high lipid and cholesterol levels should be treated with medicines if diet and exercise are not effective.  If you smoke, find out from your caregiver how to quit. If you do not use tobacco, do not start.  Lung cancer screening is recommended for adults aged 50 80 years who are at high risk for developing lung cancer because of a history of smoking. Yearly low-dose computed tomography (CT) is recommended for people who have at least a 30-pack-year history of smoking and are a current smoker or have quit within the past 15 years. A pack year of smoking is smoking an average of 1 pack of cigarettes a day for 1 year (for example: 1 pack a day for 30 years or 2 packs a day for 15 years). Yearly screening should continue until the smoker has stopped smoking for at least 15 years. Yearly screening should also be stopped for people who develop a health problem that would prevent them from having lung cancer treatment.  If you are pregnant, do not drink alcohol. If you are breastfeeding, be very cautious about drinking alcohol. If you are not pregnant and choose to drink alcohol, do not exceed 1 drink per day. One drink is considered to be 12 ounces (355 mL) of beer, 5 ounces (148 mL) of wine, or 1.5 ounces (44 mL) of liquor.  Avoid use  of street drugs. Do not share needles with anyone. Ask for help if you need support or instructions about stopping the use of drugs.  High blood pressure causes heart disease and increases the risk of stroke. Blood pressure should be checked at least every 1 to 2 years. Ongoing high blood pressure should be treated with medicines, if weight loss and exercise are not effective.  If you are 74 to 48 years old, ask your caregiver if you should take aspirin to prevent strokes.  Diabetes screening  involves taking a blood sample to check your fasting blood sugar level. This should be done once every 3 years, after age 35, if you are within normal weight and without risk factors for diabetes. Testing should be considered at a younger age or be carried out more frequently if you are overweight and have at least 1 risk factor for diabetes.  Breast cancer screening is essential preventative care for women. You should practice "breast self-awareness." This means understanding the normal appearance and feel of your breasts and may include breast self-examination. Any changes detected, no matter how small, should be reported to a caregiver. Women in their 71s and 30s should have a clinical breast exam (CBE) by a caregiver as part of a regular health exam every 1 to 3 years. After age 86, women should have a CBE every year. Starting at age 65, women should consider having a mammogram (breast X-ray) every year. Women who have a family history of breast cancer should talk to their caregiver about genetic screening. Women at a high risk of breast cancer should talk to their caregiver about having an MRI and a mammogram every year.  Breast cancer gene (BRCA)-related cancer risk assessment is recommended for women who have family members with BRCA-related cancers. BRCA-related cancers include breast, ovarian, tubal, and peritoneal cancers. Having family members with these cancers may be associated with an increased risk for harmful changes (mutations) in the breast cancer genes BRCA1 and BRCA2. Results of the assessment will determine the need for genetic counseling and BRCA1 and BRCA2 testing.  The Pap test is a screening test for cervical cancer. Women should have a Pap test starting at age 59. Between ages 22 and 23, Pap tests should be repeated every 2 years. Beginning at age 48, you should have a Pap test every 3 years as long as the past 3 Pap tests have been normal. If you had a hysterectomy for a problem that  was not cancer or a condition that could lead to cancer, then you no longer need Pap tests. If you are between ages 45 and 33, and you have had normal Pap tests going back 10 years, you no longer need Pap tests. If you have had past treatment for cervical cancer or a condition that could lead to cancer, you need Pap tests and screening for cancer for at least 20 years after your treatment. If Pap tests have been discontinued, risk factors (such as a new sexual partner) need to be reassessed to determine if screening should be resumed. Some women have medical problems that increase the chance of getting cervical cancer. In these cases, your caregiver may recommend more frequent screening and Pap tests.  The human papillomavirus (HPV) test is an additional test that may be used for cervical cancer screening. The HPV test looks for the virus that can cause the cell changes on the cervix. The cells collected during the Pap test can be tested for HPV. The HPV test  could be used to screen women aged 61 years and older, and should be used in women of any age who have unclear Pap test results. After the age of 29, women should have HPV testing at the same frequency as a Pap test.  Colorectal cancer can be detected and often prevented. Most routine colorectal cancer screening begins at the age of 17 and continues through age 35. However, your caregiver may recommend screening at an earlier age if you have risk factors for colon cancer. On a yearly basis, your caregiver may provide home test kits to check for hidden blood in the stool. Use of a small camera at the end of a tube, to directly examine the colon (sigmoidoscopy or colonoscopy), can detect the earliest forms of colorectal cancer. Talk to your caregiver about this at age 54, when routine screening begins. Direct examination of the colon should be repeated every 5 to 10 years through age 50, unless early forms of pre-cancerous polyps or small growths are  found.  Hepatitis C blood testing is recommended for all people born from [birth year] through 1964 and any individual with known risks for hepatitis C.  Practice safe sex. Use condoms and avoid high-risk sexual practices to reduce the spread of sexually transmitted infections (STIs). Sexually active women aged 15 and younger should be checked for Chlamydia, which is a common sexually transmitted infection. Older women with new or multiple partners should also be tested for Chlamydia. Testing for other STIs is recommended if you are sexually active and at increased risk.  Osteoporosis is a disease in which the bones lose minerals and strength with aging. This can result in serious bone fractures. The risk of osteoporosis can be identified using a bone density scan. Women ages 72 and over and women at risk for fractures or osteoporosis should discuss screening with their caregivers. Ask your caregiver whether you should be taking a calcium supplement or vitamin D to reduce the rate of osteoporosis.  Menopause can be associated with physical symptoms and risks. Hormone replacement therapy is available to decrease symptoms and risks. You should talk to your caregiver about whether hormone replacement therapy is right for you.  Use sunscreen. Apply sunscreen liberally and repeatedly throughout the day. You should seek shade when your shadow is shorter than you. Protect yourself by wearing long sleeves, pants, a wide-brimmed hat, and sunglasses year round, whenever you are outdoors.  Notify your caregiver of new moles or changes in moles, especially if there is a change in shape or color. Also notify your caregiver if a mole is larger than the size of a pencil eraser.  Stay current with your immunizations. Document Released: 08/23/2010 Document Revised: 06/04/2012 Document Reviewed: 08/23/2010 [facility] Patient Information 2014 [place].

## 2014-03-18 LAB — URINALYSIS W MICROSCOPIC + REFLEX CULTURE
BACTERIA UA: NONE SEEN
Bilirubin Urine: NEGATIVE
Casts: NONE SEEN
Crystals: NONE SEEN
Glucose, UA: NEGATIVE mg/dL
HGB URINE DIPSTICK: NEGATIVE
Ketones, ur: NEGATIVE mg/dL
Leukocytes, UA: NEGATIVE
Nitrite: NEGATIVE
PROTEIN: NEGATIVE mg/dL
SPECIFIC GRAVITY, URINE: 1.024 (ref 1.005–1.030)
Squamous Epithelial / LPF: NONE SEEN
Urobilinogen, UA: 0.2 mg/dL (ref 0.0–1.0)
pH: 5 (ref 5.0–8.0)

## 2014-04-14 ENCOUNTER — Ambulatory Visit (HOSPITAL_COMMUNITY): Payer: BLUE CROSS/BLUE SHIELD

## 2014-04-14 ENCOUNTER — Ambulatory Visit (HOSPITAL_COMMUNITY)
Admission: RE | Admit: 2014-04-14 | Discharge: 2014-04-14 | Disposition: A | Discharge status: Home / Self Care | Payer: BLUE CROSS/BLUE SHIELD | Source: Ambulatory Visit | Attending: Gynecology | Admitting: Gynecology

## 2014-04-14 DIAGNOSIS — Z1231 Encounter for screening mammogram for malignant neoplasm of breast: Secondary | ICD-10-CM | POA: Diagnosis not present

## 2014-07-02 ENCOUNTER — Telehealth: Payer: Self-pay | Admitting: Gynecology

## 2014-07-02 NOTE — Telephone Encounter (Signed)
07/02/14-I LM VM for pt that her BC covers the [place] IUD for contraception at 100%. She has appt for November with TF. This is a calendar year plan and I LM that these benefits are good through the end of this year. wl

## 2015-01-01 ENCOUNTER — Ambulatory Visit: Payer: BLUE CROSS/BLUE SHIELD | Admitting: Gynecology

## 2015-01-07 ENCOUNTER — Ambulatory Visit: Payer: BLUE CROSS/BLUE SHIELD | Admitting: Gynecology

## 2015-01-22 ENCOUNTER — Encounter: Payer: Self-pay | Admitting: Gynecology

## 2015-01-22 ENCOUNTER — Ambulatory Visit (INDEPENDENT_AMBULATORY_CARE_PROVIDER_SITE_OTHER): Payer: BLUE CROSS/BLUE SHIELD | Admitting: Gynecology

## 2015-01-22 VITALS — BP 134/80 | Ht 64.0 in | Wt 207.0 lb

## 2015-01-22 DIAGNOSIS — Z30433 Encounter for removal and reinsertion of intrauterine contraceptive device: Secondary | ICD-10-CM | POA: Diagnosis not present

## 2015-01-22 DIAGNOSIS — Z23 Encounter for immunization: Secondary | ICD-10-CM | POA: Diagnosis not present

## 2015-01-22 NOTE — Progress Notes (Signed)
Patient presents for Mirena IUD removal and replacement. She has read through the booklet, has no contraindications and signed the consent form.  I reviewed the removal and the insertional process with her as well as the risks to include infection, either immediate or long-term, uterine perforation or migration requiring surgery to remove, other complications such as pain and possibility of failure with subsequent pregnancy.   Exam with Wandra Scot assistant Pelvic: External BUS vagina normal. Cervix normal with IUD string visualized. Uterus anteverted normal size shape contour midline mobile nontender. Adnexa without masses or tenderness.  Procedure: The cervix was visualized with a speculum and the old Mirena IUD string was grasped with a [place] forcep and the IUD was removed, shown to the patient and discarded.  The cervix was then cleansed with Betadine, anterior lip grasped with a single-tooth tenaculum, the uterus was sounded and a Mirena IUD was placed according to manufacturer's recommendations without difficulty. The strings were trimmed. The patient tolerated well and will follow up in one month for a postinsertional check.  Lot number:  [id number]    Anastasio Auerbach MD, 12:04 PM 01/22/2015

## 2015-01-22 NOTE — Patient Instructions (Signed)
Intrauterine Device Insertion Most often, an intrauterine device (IUD) is inserted into the uterus to prevent pregnancy. There are 2 types of IUDs available:  Copper IUD--This type of IUD creates an environment that is not favorable to sperm survival. The mechanism of action of the copper IUD is not known for certain. It can stay in place for 10 years.  Hormone IUD--This type of IUD contains the hormone progestin (synthetic progesterone). The progestin thickens the cervical mucus and prevents sperm from entering the uterus, and it also thins the uterine lining. There is no evidence that the hormone IUD prevents implantation. One hormone IUD can stay in place for up to 5 years, and a different hormone IUD can stay in place for up to 3 years. An IUD is the most cost-effective birth control if left in place for the full duration. It may be removed at any time. LET YOUR HEALTH CARE PROVIDER KNOW ABOUT:  Any allergies you have.  All medicines you are taking, including vitamins, herbs, eye drops, creams, and over-the-counter medicines.  Previous problems you or members of your family have had with the use of anesthetics.  Any blood disorders you have.  Previous surgeries you have had.  Possibility of pregnancy.  Medical conditions you have. RISKS AND COMPLICATIONS  Generally, intrauterine device insertion is a safe procedure. However, as with any procedure, complications can occur. Possible complications include:  Accidental puncture (perforation) of the uterus.  Accidental placement of the IUD either in the muscle layer of the uterus (myometrium) or outside the uterus. If this happens, the IUD can be found essentially floating around the bowels and must be taken out surgically.  The IUD may fall out of the uterus (expulsion). This is more common in women who have recently had a child.   Pregnancy in the fallopian tube (ectopic).  Pelvic inflammatory disease (PID), which is infection of  the uterus and fallopian tubes. The risk of PID is slightly increased in the first 20 days after the IUD is placed, but the overall risk is still very low. BEFORE THE PROCEDURE  Schedule the IUD insertion for when you will have your menstrual period or right after, to make sure you are not pregnant. Placement of the IUD is better tolerated shortly after a menstrual cycle.  You may need to take tests or be examined to make sure you are not pregnant.  You may be required to take a pregnancy test.  You may be required to get checked for sexually transmitted infections (STIs) prior to placement. Placing an IUD in someone who has an infection can make the infection worse.  You may be given a pain reliever to take 1 or 2 hours before the procedure.  An exam will be performed to determine the size and position of your uterus.  Ask your health care provider about changing or stopping your regular medicines. PROCEDURE   A tool (speculum) is placed in the vagina. This allows your health care provider to see the lower part of the uterus (cervix).  The cervix is prepped with a medicine that lowers the risk of infection.  You may be given a medicine to numb each side of the cervix (intracervical or paracervical block). This is used to block and control any discomfort with insertion.  A tool (uterine sound) is inserted into the uterus to determine the length of the uterine cavity and the direction the uterus may be tilted.  A slim instrument (IUD inserter) is inserted through the cervical   canal and into your uterus.  The IUD is placed in the uterine cavity and the insertion device is removed.  The nylon string that is attached to the IUD and used for eventual IUD removal is trimmed. It is trimmed so that it lays high in the vagina, just outside the cervix. AFTER THE PROCEDURE  You may have bleeding after the procedure. This is normal. It varies from light spotting for a few days to menstrual-like  bleeding.  You may have mild cramping.   This information is not intended to replace advice given to you by your health care provider. Make sure you discuss any questions you have with your health care provider.   Document Released: 10/06/2010 Document Revised: 11/28/2012 Document Reviewed: 07/29/2012 Elsevier Interactive Patient Education 2016 [employer]  

## 2015-01-22 NOTE — Addendum Note (Signed)
Addended by: Burnett Kanaris on: 01/22/2015 12:35 PM   Modules accepted: Orders

## 2015-01-23 ENCOUNTER — Encounter: Payer: Self-pay | Admitting: Gynecology

## 2015-01-26 ENCOUNTER — Encounter: Payer: Self-pay | Admitting: Gynecology

## 2015-03-19 ENCOUNTER — Encounter: Payer: Self-pay | Admitting: Gynecology

## 2015-03-19 ENCOUNTER — Ambulatory Visit (INDEPENDENT_AMBULATORY_CARE_PROVIDER_SITE_OTHER): Payer: BLUE CROSS/BLUE SHIELD | Admitting: Gynecology

## 2015-03-19 VITALS — BP 114/70 | Ht 64.0 in | Wt 218.0 lb

## 2015-03-19 DIAGNOSIS — Z01419 Encounter for gynecological examination (general) (routine) without abnormal findings: Secondary | ICD-10-CM | POA: Diagnosis not present

## 2015-03-19 DIAGNOSIS — Z30431 Encounter for routine checking of intrauterine contraceptive device: Secondary | ICD-10-CM

## 2015-03-19 NOTE — Patient Instructions (Signed)

## 2015-03-19 NOTE — Progress Notes (Signed)
Reyhan [facility] Dec 25, 1965 [id number]        49 y.o.  G3P3003  for annual exam.  Several issues noted below.  Past medical history,surgical history, problem list, medications, allergies, family history and social history were all reviewed and documented as reviewed in the EPIC chart.  ROS:  Performed with pertinent positives and negatives included in the history, assessment and plan.   Additional significant findings :  none   Exam: Caryn Bee assistant Filed Vitals:   03/19/15 1042  BP: 114/70  Height: 5\' 4"  (1.626 m)  Weight: 218 lb (98.884 kg)   General appearance:  Normal affect, orientation and appearance. Skin: Grossly normal HEENT: Without gross lesions.  No cervical or supraclavicular adenopathy. Thyroid normal.  Lungs:  Clear without wheezing, rales or rhonchi Cardiac: RR, without RMG Abdominal:  Soft, nontender, without masses, guarding, rebound, organomegaly or hernia Breasts:  Examined lying and sitting without masses, retractions, discharge or axillary adenopathy. Pelvic:  Ext/BUS/vagina normal  Cervix normal with IUD string visualized an appropriate length  Uterus anteverted, normal size, shape and contour, midline and mobile nontender   Adnexa  Without masses or tenderness    Anus and perineum  Normal   Rectovaginal  Normal sphincter tone without palpated masses or tenderness.    Assessment/Plan:  50 y.o. G14P3003 female for annual exam without menses, Mirena IUD.   1. Mirena IUD just placed 01/22/2015. Doing well without complaints. IUD string visualized an appropriate length. 2. Mammography 12/2014. Continue with annual mammography. SBE monthly reviewed. 3. Pap smear/HPV negative 2014.  No Pap smear done today. History of LGSIL 2009 with normal Pap smears since then. 4. Anxiety. Patient on Zoloft but is obtaining her prescriptions elsewhere. 5. Health maintenance. Had recent evaluation at [facility] with lab work. She'll continue to follow up with them  routinely for general health maintenance.  I encouraged her to call them in follow up for lab results that were recently done. Follow up here in one year, sooner as needed.   Anastasio Auerbach MD, 11:19 AM 03/19/2015

## 2015-03-27 ENCOUNTER — Encounter (HOSPITAL_COMMUNITY): Payer: Self-pay | Admitting: Family Medicine

## 2015-03-27 ENCOUNTER — Emergency Department (HOSPITAL_COMMUNITY): Payer: BLUE CROSS/BLUE SHIELD

## 2015-03-27 ENCOUNTER — Emergency Department (HOSPITAL_COMMUNITY)
Admission: EM | Admit: 2015-03-27 | Discharge: 2015-03-27 | Disposition: A | Discharge status: Home / Self Care | Payer: BLUE CROSS/BLUE SHIELD | Attending: Emergency Medicine | Admitting: Emergency Medicine

## 2015-03-27 DIAGNOSIS — H538 Other visual disturbances: Secondary | ICD-10-CM | POA: Diagnosis not present

## 2015-03-27 DIAGNOSIS — R0789 Other chest pain: Secondary | ICD-10-CM

## 2015-03-27 DIAGNOSIS — F329 Major depressive disorder, single episode, unspecified: Secondary | ICD-10-CM | POA: Diagnosis not present

## 2015-03-27 DIAGNOSIS — Z79899 Other long term (current) drug therapy: Secondary | ICD-10-CM | POA: Diagnosis not present

## 2015-03-27 DIAGNOSIS — Z8739 Personal history of other diseases of the musculoskeletal system and connective tissue: Secondary | ICD-10-CM | POA: Diagnosis not present

## 2015-03-27 DIAGNOSIS — Z7982 Long term (current) use of aspirin: Secondary | ICD-10-CM | POA: Diagnosis not present

## 2015-03-27 DIAGNOSIS — R0602 Shortness of breath: Secondary | ICD-10-CM | POA: Insufficient documentation

## 2015-03-27 DIAGNOSIS — Z85828 Personal history of other malignant neoplasm of skin: Secondary | ICD-10-CM | POA: Diagnosis not present

## 2015-03-27 DIAGNOSIS — R079 Chest pain, unspecified: Secondary | ICD-10-CM | POA: Diagnosis present

## 2015-03-27 DIAGNOSIS — F419 Anxiety disorder, unspecified: Secondary | ICD-10-CM | POA: Insufficient documentation

## 2015-03-27 DIAGNOSIS — R51 Headache: Secondary | ICD-10-CM | POA: Diagnosis not present

## 2015-03-27 LAB — COMPREHENSIVE METABOLIC PANEL
ALBUMIN: 3.9 g/dL (ref 3.5–5.0)
ALK PHOS: 71 U/L (ref 38–126)
ALT: 34 U/L (ref 14–54)
AST: 30 U/L (ref 15–41)
Anion gap: 13 (ref 5–15)
BILIRUBIN TOTAL: 0.5 mg/dL (ref 0.3–1.2)
BUN: 14 mg/dL (ref 6–20)
CALCIUM: 9.9 mg/dL (ref 8.9–10.3)
CO2: 24 mmol/L (ref 22–32)
CREATININE: 0.75 mg/dL (ref 0.44–1.00)
Chloride: 104 mmol/L (ref 101–111)
GFR calc Af Amer: >60 mL/min (ref >60)
GFR calc non Af Amer: >60 mL/min (ref >60)
GLUCOSE: 91 mg/dL (ref 65–99)
Potassium: 4.4 mmol/L (ref 3.5–5.1)
SODIUM: 141 mmol/L (ref 135–145)
Total Protein: 6.8 g/dL (ref 6.5–8.1)

## 2015-03-27 LAB — CBC
HEMATOCRIT: 39.4 % (ref 36.0–46.0)
HEMOGLOBIN: 13.4 g/dL (ref 12.0–15.0)
MCH: 30 pg (ref 26.0–34.0)
MCHC: 34 g/dL (ref 30.0–36.0)
MCV: 88.3 fL (ref 78.0–100.0)
Platelets: 349 10*3/uL (ref 150–400)
RBC: 4.46 MIL/uL (ref 3.87–5.11)
RDW: 13.9 % (ref 11.5–15.5)
WBC: 7.7 10*3/uL (ref 4.0–10.5)

## 2015-03-27 LAB — I-STAT TROPONIN, ED: TROPONIN I, POC: 0 ng/mL (ref 0.00–0.08)

## 2015-03-27 MED ORDER — PROMETHAZINE HCL 25 MG/ML IJ SOLN: 25.0000 mg | Freq: Once | INTRAMUSCULAR | Status: DC

## 2015-03-27 NOTE — ED Notes (Signed)
Patient transported to CT 

## 2015-03-27 NOTE — ED Notes (Signed)
Pt here for intermittent chest pain and SOB for the past couple of days. sts hurts with breathing. sts some stress lately.

## 2015-03-27 NOTE — ED Provider Notes (Signed)
CSN: [id number]     Arrival date & time 03/27/15  1129 History   First MD Initiated Contact with Patient 03/27/15 1134     Chief Complaint  Patient presents with  . Chest Pain     (Consider location/radiation/quality/duration/timing/severity/associated sxs/prior Treatment) The history is provided by the patient.  Katrina Huff is a 50 y.o. female hx of anxiety, depression, here with chest pain, shortness of breath. Left-sided chest pain and intermittent shortness breath for the last several days. States that she's been stressed out lately. She states that she had worsening pain that radiated down her left arm and clavicle since yesterday. Denies exertional component to the pain. Denies any recent travel or history of blood clots. Denies any history of MI or CAD. Has intermittent headaches for several days that is worse at night. Occasional blurry vision at night but no vomiting or neck pain   Past Medical History  Diagnosis Date  . LGSIL (low grade squamous intraepithelial dysplasia) 10/2007  . Disc disorder     L4,L5  . Anxiety   . Mental disorder   . Depression   . Basal cell carcinoma    Past Surgical History  Procedure Laterality Date  . Colposcopy    . Mirena      Inserted 01/22/2015  . Maximum access (mas)posterior lumbar interbody fusion (plif) 1 level N/A 12/04/2012    Procedure: Lumbar four-five Maximum Access Surgery Posterior Lumbar Interbody Fusion;  Surgeon: Eustace Moore, MD;  Location: [place] [facility];  Service: Neurosurgery;  Laterality: N/A;  Lumbar four-five Maximum Access Surgery Posterior Lumbar Interbody Fusion  . Basal cell cancer excised     Family History  Problem Relation Age of Onset  . Diabetes Father   . Cancer Father     Carcinoid  . Breast cancer Mother     Age 27  . Breast cancer Maternal Grandmother     Onset postmenopausal  . Coronary artery disease Maternal Grandmother    Social History  Substance Use Topics  . Smoking status: Never Smoker    . Smokeless tobacco: Never Used  . Alcohol Use: 0.0 oz/week    0 Standard drinks or equivalent per week     Comment: occasional   OB History    Gravida Para Term Preterm AB TAB SAB Ectopic Multiple Living   3 3 3       3      Review of Systems  Cardiovascular: Positive for chest pain.  All other systems reviewed and are negative.     Allergies  Septra and Cephalosporins  Home Medications   Prior to Admission medications   Medication Sig Start Date End Date Taking? Authorizing Provider  ALPRAZolam Duanne Moron) 0.25 MG tablet Take 0.25 mg by mouth at bedtime as needed for anxiety.   Yes Historical Provider, MD  Ascorbic Acid (VITAMIN C) 1000 MG tablet Take 1,000 mg by mouth daily.   Yes Historical Provider, MD  aspirin EC 81 MG tablet Take 81 mg by mouth daily.   Yes Historical Provider, MD  Cholecalciferol (VITAMIN D3) 5000 UNITS CAPS Take 1 capsule by mouth.   Yes Historical Provider, MD  sertraline (ZOLOFT) 100 MG tablet Take 1.5 tablets (150 mg total) by mouth daily. Patient taking differently: Take 200 mg by mouth daily.  03/17/14  Yes Anastasio Auerbach, MD  levonorgestrel (MIRENA) 20 MCG/24HR IUD 1 each by Intrauterine route once.    Historical Provider, MD   BP 117/68 mmHg  Pulse 61  Temp(Src) 98.3  F (36.8 C) (Oral)  Resp 18  SpO2 100% Physical Exam  Constitutional: She is oriented to person, place, and time. She appears well-developed and well-nourished.  Anxious   HENT:  Head: Normocephalic.  Eyes: Conjunctivae are normal. Pupils are equal, round, and reactive to light.  Neck: Normal range of motion. Neck supple.  Cardiovascular: Normal rate, regular rhythm and normal heart sounds.   Pulmonary/Chest: Effort normal and breath sounds normal. No respiratory distress. She has no wheezes. She has no rales. She exhibits no tenderness.  Abdominal: Soft. Bowel sounds are normal.  Musculoskeletal: Normal range of motion. She exhibits no edema or tenderness.  Neurological:  She is alert and oriented to person, place, and time. No cranial nerve deficit. Coordination normal.  CN 2-12 intact. Nl strength throughout   Skin: Skin is warm and dry.  Psychiatric: She has a normal mood and affect. Her behavior is normal. Judgment and thought content normal.  Nursing note and vitals reviewed.   ED Course  Procedures (including critical care time) Labs Review Labs Reviewed  CBC  COMPREHENSIVE METABOLIC PANEL  I-STAT [place], ED  Randolm Idol, ED    Imaging Review Dg Chest 2 View  03/27/2015  CLINICAL DATA:  Shortness of breath and chest pain EXAM: CHEST  2 VIEW COMPARISON:  12/03/2012 FINDINGS: The heart size and mediastinal contours are within normal limits. Both lungs are clear. The visualized skeletal structures are unremarkable. IMPRESSION: No active cardiopulmonary disease. Electronically Signed   By: Kerby Moors M.D.   On: 03/27/2015 12:12   Ct Head Wo Contrast  03/27/2015  CLINICAL DATA:  Headache, blurry vision. EXAM: CT HEAD WITHOUT CONTRAST TECHNIQUE: Contiguous axial images were obtained from the base of the skull through the vertex without intravenous contrast. COMPARISON:  CT scan of October 26, 2013. FINDINGS: Bony calvarium appears intact. No mass effect or midline shift is noted. Ventricular size is within normal limits. There is no evidence of mass lesion, hemorrhage or acute infarction. IMPRESSION: Normal head CT. Electronically Signed   By: Marijo Conception, M.D.   On: 03/27/2015 12:20   I have personally reviewed and evaluated these images and lab results as part of my medical decision-making.   EKG Interpretation   Date/Time:  Friday March 27 2015 11:39:03 EST Ventricular Rate:  57 PR Interval:  152 QRS Duration: 97 QT Interval:  440 QTC Calculation: 428 R Axis:   -51 Text Interpretation:  Sinus rhythm Left anterior fascicular block No  significant change since last tracing Confirmed by Olia Hinderliter  MD, Dillion Stowers ([id number])  on 03/27/2015  11:40:58 AM      MDM   Final diagnoses:  None    Katrina Huff is a 50 y.o. female here with chest pain. Low risk for ACS and symptoms for several days so trop x 1 sufficient. Not tachy or hypotensive and low risk for PE so will not need d-dimer. Will get labs, CXR, trop. Had new onset headaches worse at night, nl neuro exam. Will get CT head to assess. Refused pain meds and migraine cocktail.   1:37 PM Patient feels fine. Labs and CT head and trop neg. Likely anxiety vs stress. Will dc home. If has persistent symptoms, can get outpatient stress test.    Wandra Arthurs, MD 03/27/15 [phone]

## 2015-03-27 NOTE — Discharge Instructions (Signed)
See your doctor if you still have pain in a week then you may need a stress test.   Take xanax as needed for anxiety.   Return to ER if you have worse chest pain, shortness of breath, headaches.

## 2015-03-27 NOTE — ED Notes (Signed)
Pt ambulates independently and with steady gait at time of discharge. Discharge instructions and follow up information reviewed with patient. No other questions or concerns voiced at this time.  

## 2015-05-20 ENCOUNTER — Other Ambulatory Visit: Payer: Self-pay | Admitting: Gynecology

## 2015-05-20 NOTE — Telephone Encounter (Signed)
Anxiety. Patient on Zoloft but is obtaining her prescriptions elsewhere. Per note on 03/19/15

## 2015-06-03 ENCOUNTER — Telehealth: Payer: Self-pay | Admitting: Oncology

## 2015-06-03 ENCOUNTER — Encounter: Payer: Self-pay | Admitting: Oncology

## 2015-06-03 NOTE — Telephone Encounter (Signed)
Verified insurance and address, faxed referring provider, mailed new pt packet, scheduled intake

## 2015-06-24 ENCOUNTER — Telehealth: Payer: Self-pay | Admitting: Oncology

## 2015-06-24 NOTE — Telephone Encounter (Signed)
Pt called to reschedule appt due to a conflict moved to [phone]

## 2015-06-25 ENCOUNTER — Ambulatory Visit: Payer: BLUE CROSS/BLUE SHIELD | Admitting: Oncology

## 2015-06-29 ENCOUNTER — Ambulatory Visit: Payer: BLUE CROSS/BLUE SHIELD | Admitting: Family Medicine

## 2015-07-14 ENCOUNTER — Telehealth: Payer: Self-pay | Admitting: Oncology

## 2015-07-14 ENCOUNTER — Ambulatory Visit (HOSPITAL_BASED_OUTPATIENT_CLINIC_OR_DEPARTMENT_OTHER): Payer: BLUE CROSS/BLUE SHIELD | Admitting: Oncology

## 2015-07-14 VITALS — BP 121/60 | HR 55 | Temp 98.3°F | Resp 20 | Ht 64.0 in | Wt 224.5 lb

## 2015-07-14 DIAGNOSIS — D72829 Elevated white blood cell count, unspecified: Secondary | ICD-10-CM

## 2015-07-14 DIAGNOSIS — F329 Major depressive disorder, single episode, unspecified: Secondary | ICD-10-CM

## 2015-07-14 DIAGNOSIS — F411 Generalized anxiety disorder: Secondary | ICD-10-CM | POA: Diagnosis not present

## 2015-07-14 NOTE — Telephone Encounter (Signed)
Gave and printed appt sched and avs for pt for Aug °

## 2015-07-14 NOTE — Progress Notes (Signed)
Please see consult note.  

## 2015-07-14 NOTE — Consult Note (Signed)
Reason for Referral: Leukocytosis.   HPI: This is a pleasant 50 year old woman referred to me for evaluation of elevated white cell count. She is a healthy woman without any significant comorbid conditions. She does have anxiety and depression and have reported a lot of stress associated with her personal life. She was noted that to have elevated white cell count of 13.6 on December 2016. Her hemoglobin was normal at 12.7 with normal platelet count. Her differential was normal with neutrophils, lymphocytes monocytes as well as basophils. At that time she was prescribed antibiotics although no specific illness was documented. Repeat CBC on 05/23/2015 showed her white cell count is down to 11.3 with a normal hemoglobin and her platelet count slightly elevated at 388. She continues to be asymptomatic from these findings. She denied any constitutional symptoms. Appetite remains about the same. She denied any joint swelling or pain. She does report smoking marijuana occasionally. She denied any recurrent infections. She denied any recent hospitalization or sickness. She continues to be very active and works full time.  She denies any headaches, blurry vision, syncope or seizures. She does not report any fevers, chills or sweats or weight loss. He does not report any chest pain, palpitation orthopnea. She does not report any cough, wheezing or hemoptysis. She does not report any nausea, vomiting or abdominal pain. She does not report any frequency urgency or hesitancy. She does not report any skeletal complaints of arthralgias or myalgias. Remaining review of systems unremarkable.   Past Medical History  Diagnosis Date  . LGSIL (low grade squamous intraepithelial dysplasia) 10/2007  . Disc disorder     L4,L5  . Anxiety   . Mental disorder   . Depression   . Basal cell carcinoma   :  Past Surgical History  Procedure Laterality Date  . Colposcopy    . Mirena      Inserted 01/22/2015  . Maximum access  (mas)posterior lumbar interbody fusion (plif) 1 level N/A 12/04/2012    Procedure: Lumbar four-five Maximum Access Surgery Posterior Lumbar Interbody Fusion;  Surgeon: Eustace Moore, MD;  Location: [place] [facility];  Service: Neurosurgery;  Laterality: N/A;  Lumbar four-five Maximum Access Surgery Posterior Lumbar Interbody Fusion  . Basal cell cancer excised    :   Current outpatient prescriptions:  .  ALPRAZolam (XANAX) 0.25 MG tablet, Take 0.25 mg by mouth at bedtime as needed for anxiety., Disp: , Rfl:  .  Ascorbic Acid (VITAMIN C) 1000 MG tablet, Take 1,000 mg by mouth daily., Disp: , Rfl:  .  aspirin EC 81 MG tablet, Take 81 mg by mouth daily., Disp: , Rfl:  .  Cholecalciferol (VITAMIN D3) 5000 UNITS CAPS, Take 1 capsule by mouth., Disp: , Rfl:  .  levonorgestrel (MIRENA) 20 MCG/24HR IUD, 1 each by Intrauterine route once., Disp: , Rfl:  .  sertraline (ZOLOFT) 100 MG tablet, Take 1.5 tablets (150 mg total) by mouth daily. (Patient taking differently: Take 200 mg by mouth daily. ), Disp: 135 tablet, Rfl: 4:  Allergies  Allergen Reactions  . Septra [Sulfamethoxazole-Trimethoprim] Rash    Mild rash  . Cephalosporins Rash  :  Family History  Problem Relation Age of Onset  . Diabetes Father   . Cancer Father     Carcinoid  . Breast cancer Mother     Age 75  . Breast cancer Maternal Grandmother     Onset postmenopausal  . Coronary artery disease Maternal Grandmother   :  Social History   Social  History  . Marital Status: Divorced    Spouse Name: N/A  . Number of Children: 3  . Years of Education: COLLEGE    Occupational History  .      Mother  .  Other   Social History Main Topics  . Smoking status: Never Smoker   . Smokeless tobacco: Never Used  . Alcohol Use: 0.0 oz/week    0 Standard drinks or equivalent per week     Comment: occasional  . Drug Use: Yes    Special: Marijuana  . Sexual Activity:    Partners: Male    Birth Control/ Protection: IUD     Comment:  Mirena inserted 01/22/2015-1st intercourse 50 yo-More than 5 partners   Other Topics Concern  . Not on file   Social History Narrative   Marital Status: Divorced    Children:  Sons (2), Daughter (1)   Pets: Dog/Cat /Fish   Living Situation: Lives with children and roommate.   Occupation: Agricultural engineer    Education: Forensic psychologist (Sociology)    Tobacco Use/Exposure:  None    Alcohol Use:  Occasional   Drug Use:  None   Diet:  Regular   Exercise:  Limited    Hobbies: Reading, Tennis, Writing  :  Pertinent items are noted in HPI.  Exam: Blood pressure 121/60, pulse 55, temperature 98.3 F (36.8 C), temperature source Oral, resp. rate 20, height 5\' 4"  (1.626 m), weight 224 lb 8 oz (101.833 kg), SpO2 97 %. General appearance: alert and cooperative Head: Normocephalic, without obvious abnormality Throat: lips, mucosa, and tongue normal; teeth and gums normal Neck: no adenopathy Back: negative Resp: clear to auscultation bilaterally Chest wall: no tenderness Cardio: regular rate and rhythm, S1, S2 normal, no murmur, click, rub or gallop Extremities: extremities normal, atraumatic, no cyanosis or edema Pulses: 2+ and symmetric Skin: Skin color, texture, turgor normal. No rashes or lesions Lymph nodes: Cervical, supraclavicular, and axillary nodes normal.  CBC    Component Value Date/Time   WBC 7.7 03/27/2015 1142   RBC 4.46 03/27/2015 1142   HGB 13.4 03/27/2015 1142   HCT 39.4 03/27/2015 1142   PLT 349 03/27/2015 1142   MCV 88.3 03/27/2015 1142   MCH 30.0 03/27/2015 1142   MCHC 34.0 03/27/2015 1142   RDW 13.9 03/27/2015 1142   LYMPHSABS 1.9 03/17/2014 1213   MONOABS 0.6 03/17/2014 1213   EOSABS 0.2 03/17/2014 1213   BASOSABS 0.0 03/17/2014 1213       Assessment and Plan:    50 year old woman with the following issues:  1. Leukocytosis documented on 2 separate occasions between December 2016 and April 2017. Her white cell count of range between 13,000 and then to  11,000. She had normal differential otherwise. Other CBCs noted in January 2016 and February 2017 all within normal range.  The differential diagnosis was reviewed today for the etiology of her leukocytosis. This is represents a reactive leukocytosis rather than a primary hematological disorder. Condition such as infection, arthritis, smoking and stress could have contributed to her leukocytosis which appears to be resolving. Other considerations would include primary hematological disorder such as myeloproliferative disorder are unlikely. CML would be the most common condition that needs to be ruled out in her age group.  Leukocytosis related to solid tumor malignancy is also likely. She did have a chest x-ray in February 2017 which was unremarkable.  From a management standpoint, I have recommended repeat CBC in 3 months and if it is back to normal range,  no further evaluation would be needed. If her white cell count continues to be elevated, we will evaluate her for chronic myelogenous leukemia with molecular testing.  All her questions were answered to her satisfaction. She'll follow-up in 3 months for repeat counts.  2. Age-appropriate cancer screening: She appears to be up-to-date with mammography as well as colonoscopy.

## 2015-08-20 IMAGING — CT CT ANGIO HEAD
2 of 6 series · 7 of 33 positions shown · IV contrast (OMNIPAQUE 350)
Comparison: None.

ADDENDUM:
Patient returned for T1 weighted imaging. No vertebral artery
dissection is visualized.
CLINICAL DATA: 47-year-old female awoke with left facial numbness
and diplopia ER right eye. Slurred speech.

EXAM:
CT ANGIOGRAPHY HEAD AND NECK
TECHNIQUE: Multidetector CT imaging of the head and neck was performed using
the standard protocol during bolus administration of intravenous
contrast. Multiplanar CT image reconstructions and MIPs were
obtained to evaluate the vascular anatomy. Carotid stenosis
measurements (when applicable) are obtained utilizing NASCET
criteria, using the distal internal carotid diameter as the
denominator.
CONTRAST:  100mL OMNIPAQUE IOHEXOL 350 MG/ML SOLN

[Series 7: axial thin · axial · 0.39mm/px · z∈[-304,-55]mm · 6 of 353 slices shown]
[im 51/353  soft-tissue]
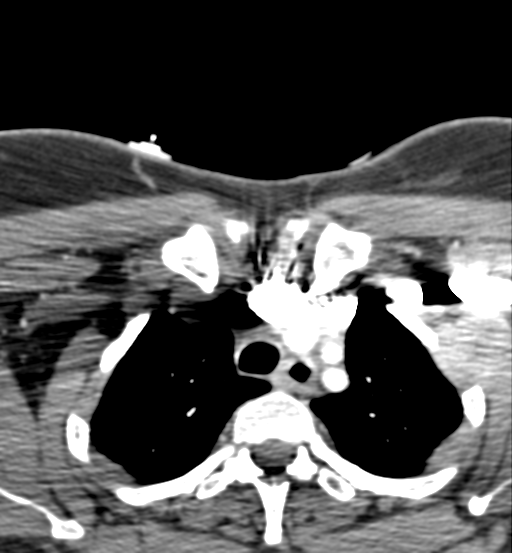
[im 101/353  bone]
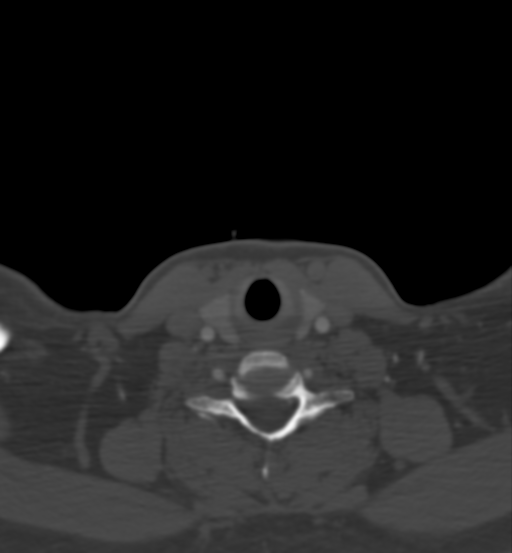
[im 151/353  soft-tissue]
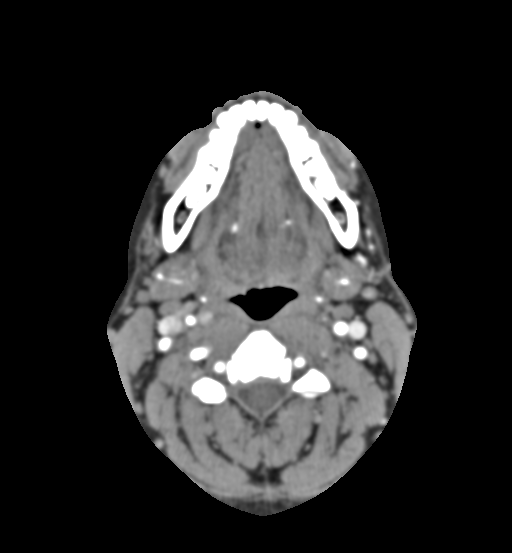
[im 202/353  bone]
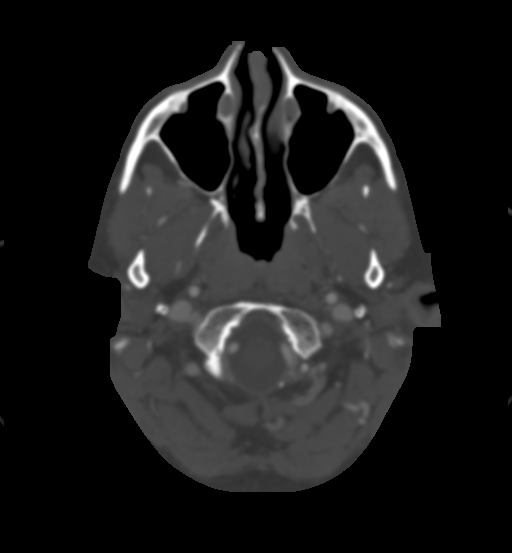
[im 252/353  soft-tissue]
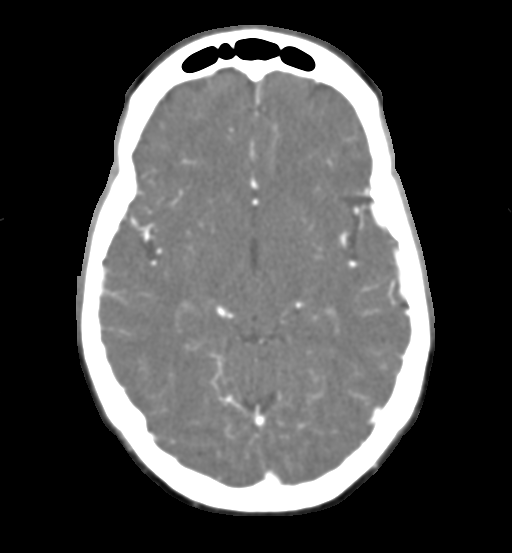
[im 302/353  bone]
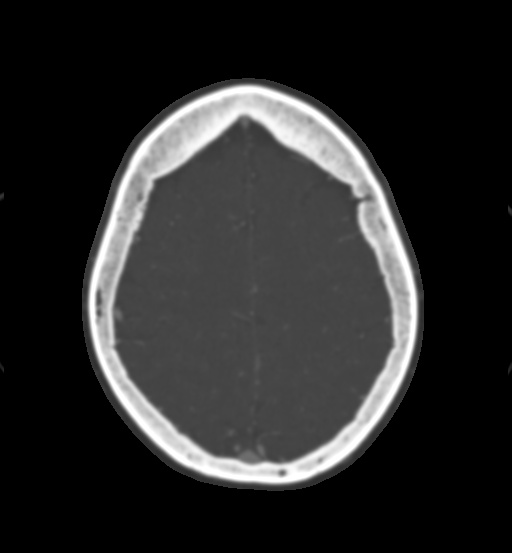

[Series 603: <mpr range(1)> · sagittal · 0.66mm/px · 1 of 111 slices shown]
[im 56/111  soft-tissue]
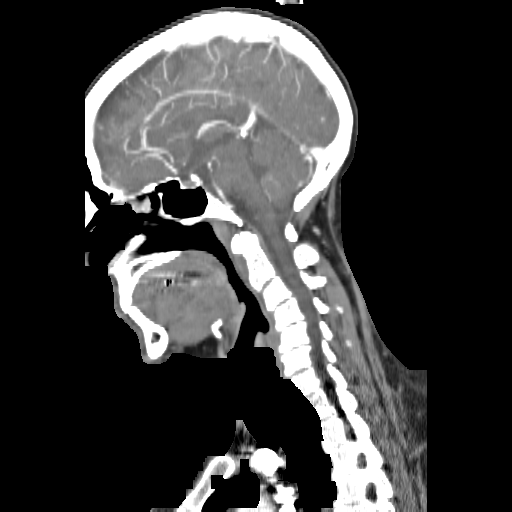

[7 of 33 positions shown; findings below may reference images not displayed]

FINDINGS: CTA HEAD FINDINGS

No medium or large size intracranial arterial vessel significant
stenosis or occlusion.

Ectatic vessels at the anterior communicating artery region without
aneurysm identified.

No intracranial hemorrhage.

No intracranial enhancing lesion.

No hydrocephalus.

White matter type changes seen on recent MR not as well appreciated
on present CT.

Review of the MIP images confirms the above findings.

CTA NECK FINDINGS

No evidence of carotid dissection or significant stenosis. Mild
ectasia greater on the left.

No evidence of right vertebral artery dissection or abnormality.

Left vertebral artery at the C6 level changes caliber slightly.
Although this may represent a normal finding, subtle dissection
cannot be entirely excluded. Fat-suppressed T1 weighted imaging
through this level can be obtained for further delineation

Cervical spondylotic changes greater on the right at the C3-4 level,
C4-5 level and C5-6 level. Mild cervical kyphosis centered at C4-5
level.

The visualized lung apices are clear.

No worrisome primary neck mass or adenopathy.

Review of the MIP images confirms the above findings.
IMPRESSION: CTA HEAD :

No medium or large size intracranial arterial vessel significant
stenosis or occlusion.

Ectatic vessels at the anterior communicating artery region without
aneurysm identified.

White matter type changes seen on recent MR not as well appreciated
on present CT.

Review of the MIP images confirms the above findings.

CTA NECK :

Left vertebral artery at the C6 level changes caliber slightly.
Although this may represent a normal finding, subtle dissection
cannot be entirely excluded. Fat-suppressed T1 weighted imaging
through this level can be obtained for further delineation

No evidence of carotid dissection or significant stenosis. Mild
ectasia greater on the left.

No evidence of right vertebral artery dissection or abnormality.

Cervical spondylotic changes greater on the right at the C3-4 level,
C4-5 level and C5-6 level. Mild cervical kyphosis centered at C4-5
level.

These results were called by telephone at the time of interpretation
on 10/26/2013 at [DATE] to Dr. Chiang , who verbally acknowledged
these results.

## 2015-08-20 IMAGING — CT CT HEAD W/O CM
2 series · 16 of 30 positions shown, 20 images · non-contrast
Comparison: None.

CLINICAL DATA: Facial numbness

EXAM:
CT HEAD WITHOUT CONTRAST
TECHNIQUE: Contiguous axial images were obtained from the base of the skull
through the vertex without intravenous contrast.

[Series 2: head w/o · axial · non-contrast · 0.45mm/px · z∈[-160,-40]mm · 13 of 29 slices shown, 17 images]
[im 3/29  brain]
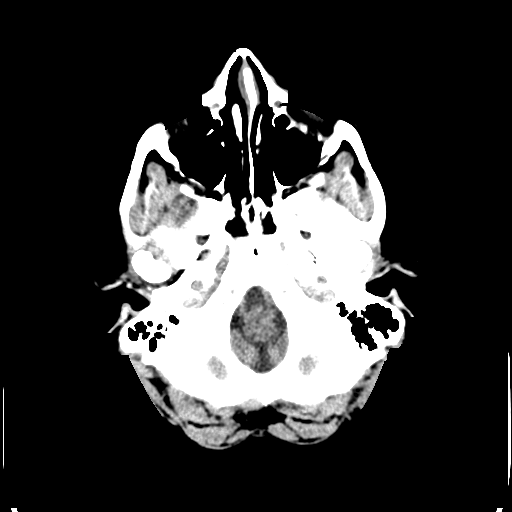
[im 3/29  bone]
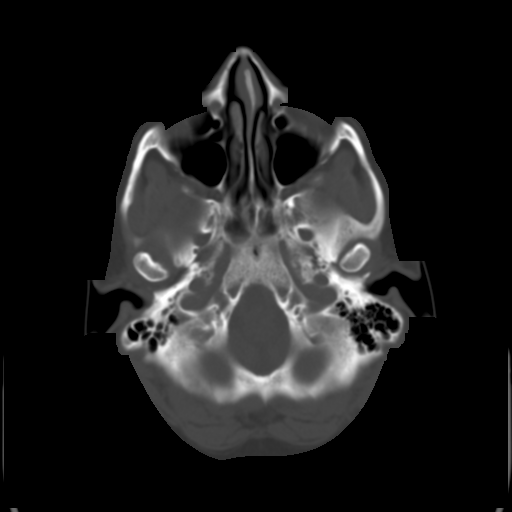
[im 5/29  brain]
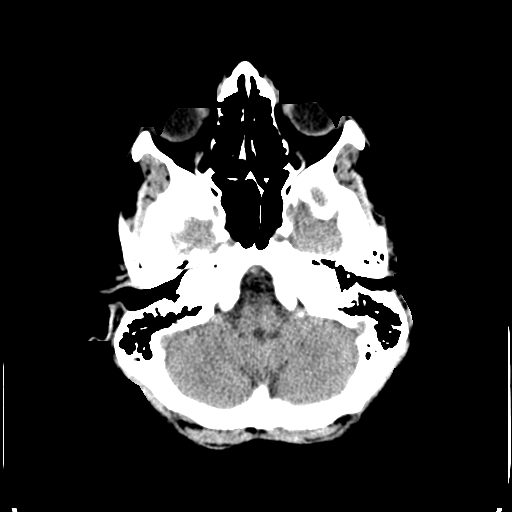
[im 7/29  brain]
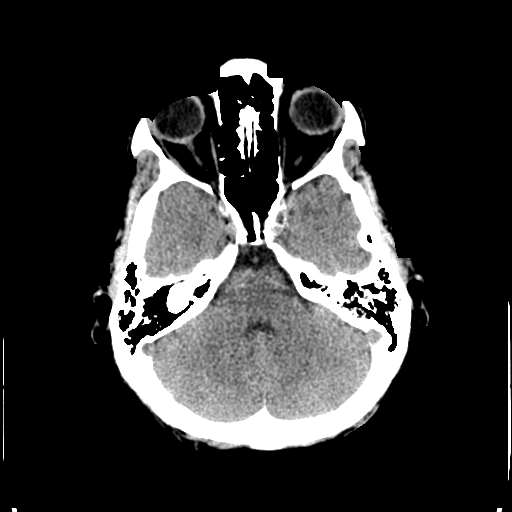
[im 9/29  brain]
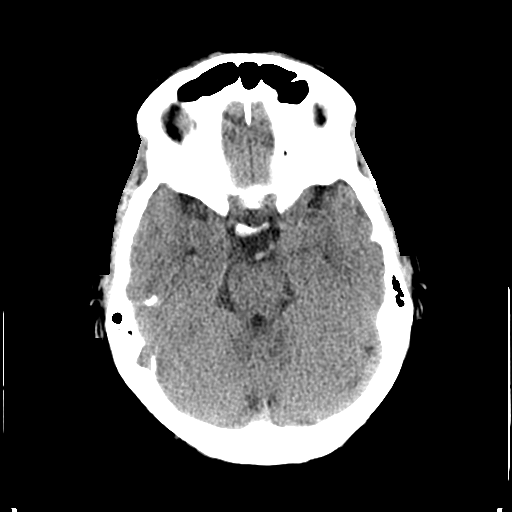
[im 11/29  brain]
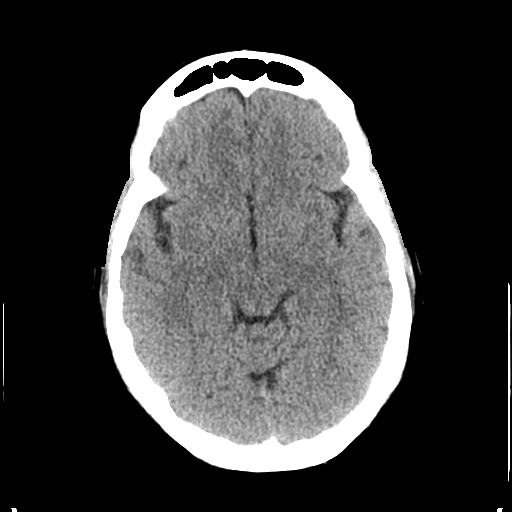
[im 11/29  bone]
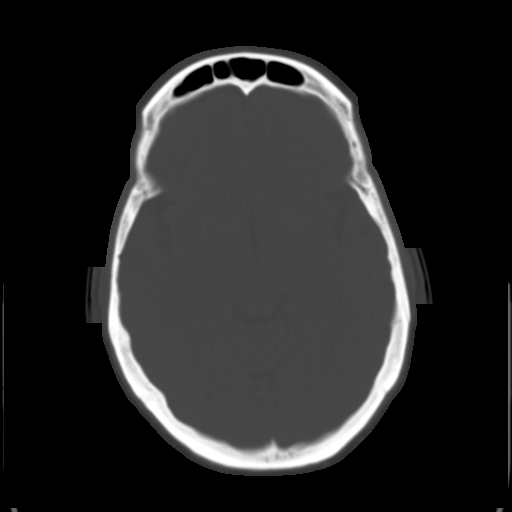
[im 13/29  brain]
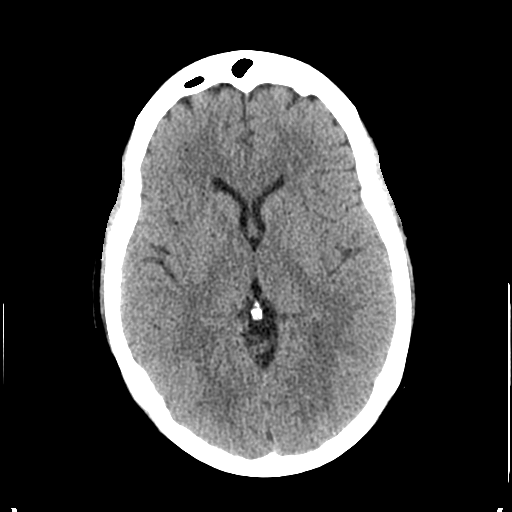
[im 15/29  brain]
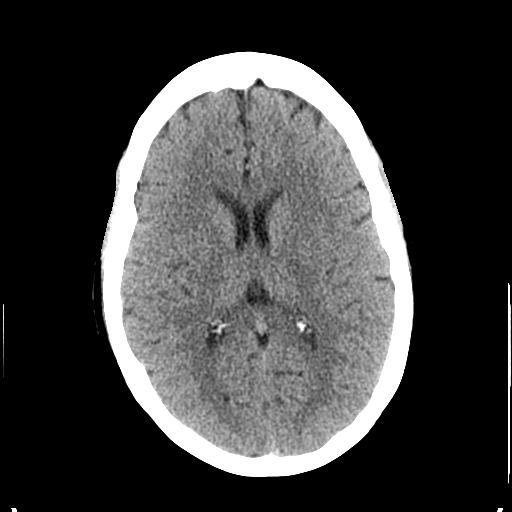
[im 17/29  brain]
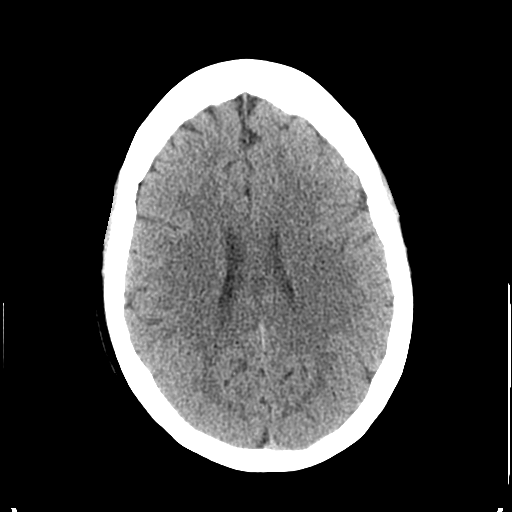
[im 19/29  brain]
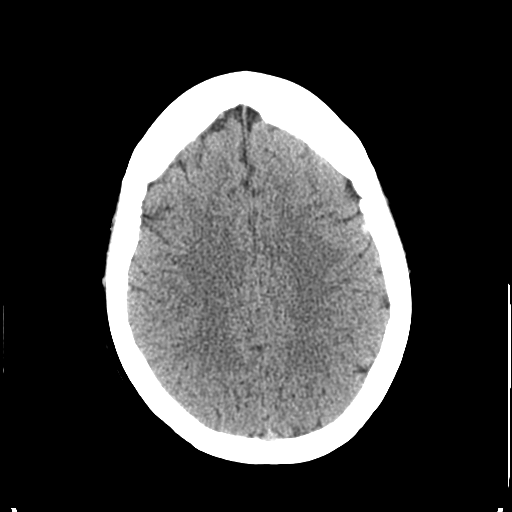
[im 19/29  bone]
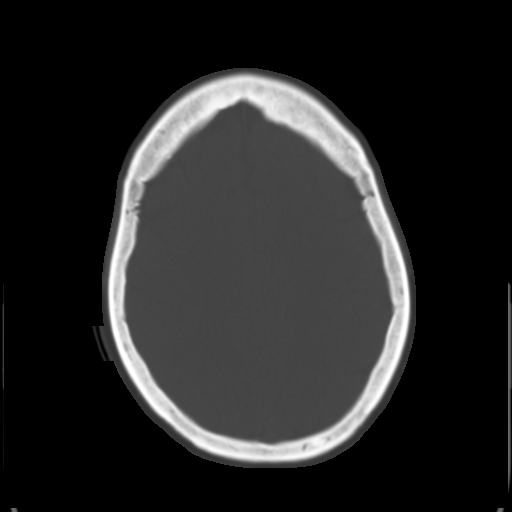
[im 21/29  brain]
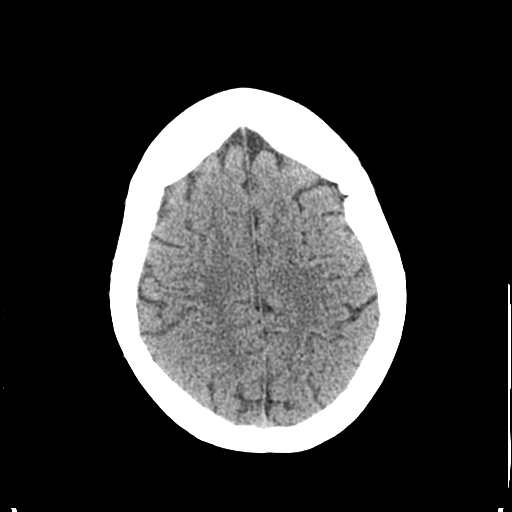
[im 23/29  brain]
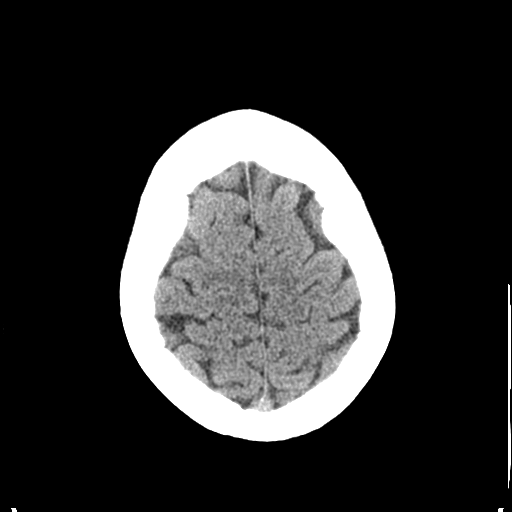
[im 25/29  brain]
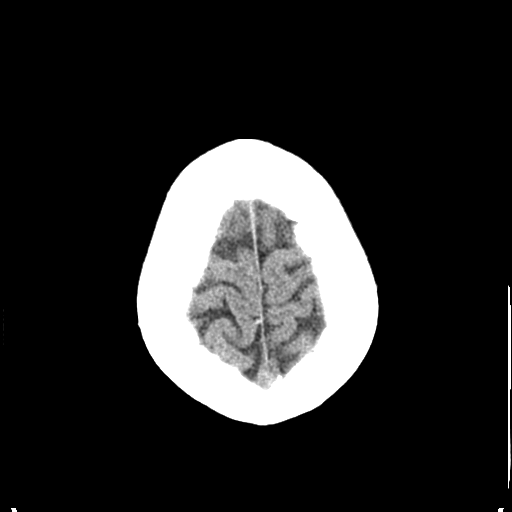
[im 27/29  brain]
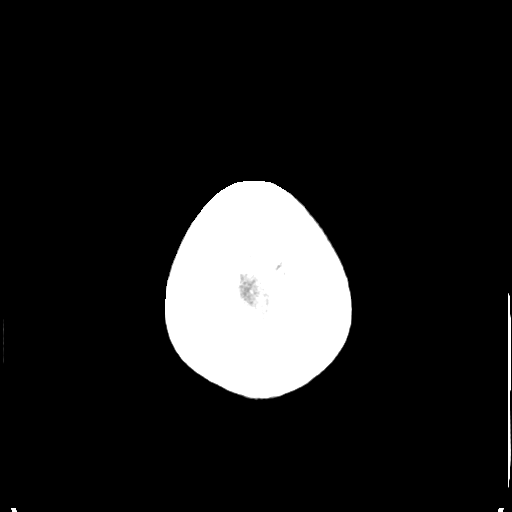
[im 27/29  bone]
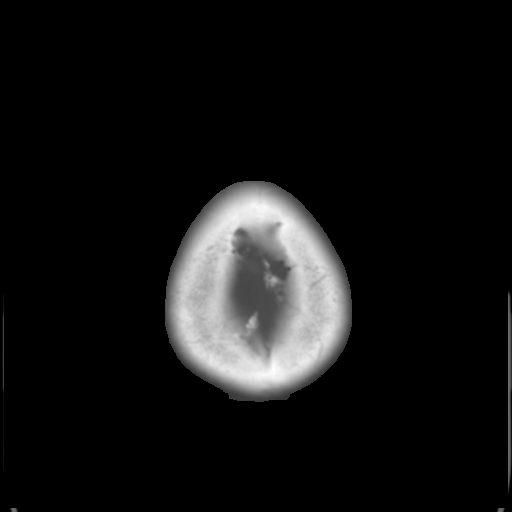

[Series 3: bone windows · axial · 0.45mm/px · z∈[-160,-120]mm · 3 of 29 slices shown]
[im 3/29  bone]
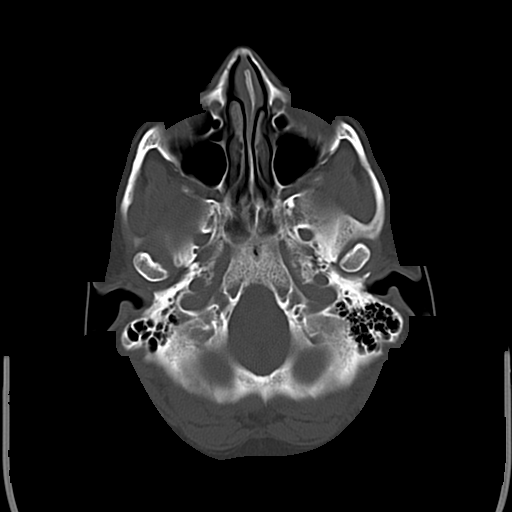
[im 7/29  bone]
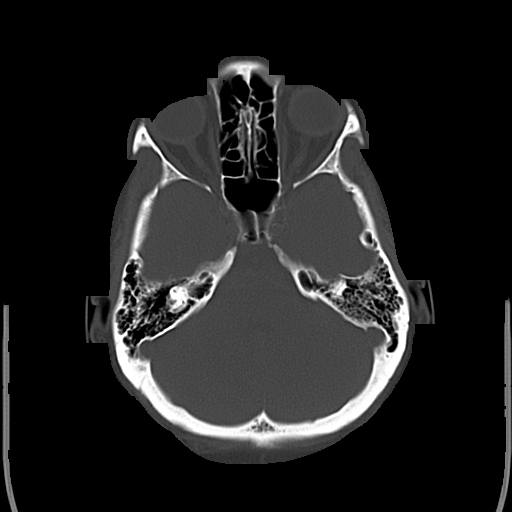
[im 11/29  bone]
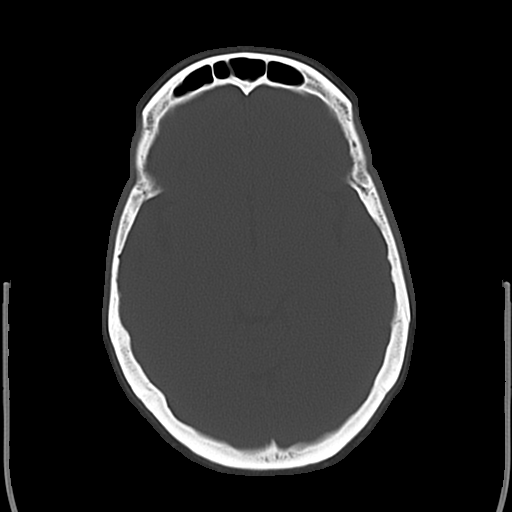

[16 of 30 positions shown; findings below may reference images not displayed]

FINDINGS: Brain parenchyma and ventricular system are within normal limits. No
mass effect, midline shift, or acute hemorrhage. Mastoid air cells
are clear. No fracture.
IMPRESSION: Negative head CT.

## 2015-10-20 ENCOUNTER — Other Ambulatory Visit (HOSPITAL_BASED_OUTPATIENT_CLINIC_OR_DEPARTMENT_OTHER): Payer: BLUE CROSS/BLUE SHIELD

## 2015-10-20 ENCOUNTER — Ambulatory Visit (HOSPITAL_BASED_OUTPATIENT_CLINIC_OR_DEPARTMENT_OTHER): Payer: BLUE CROSS/BLUE SHIELD | Admitting: Oncology

## 2015-10-20 VITALS — BP 104/65 | HR 57 | Temp 98.3°F | Resp 17 | Ht 64.0 in | Wt 224.7 lb

## 2015-10-20 DIAGNOSIS — D72829 Elevated white blood cell count, unspecified: Secondary | ICD-10-CM | POA: Diagnosis not present

## 2015-10-20 LAB — CBC WITH DIFFERENTIAL/PLATELET
BASO%: 0.3 % (ref 0.0–2.0)
Basophils Absolute: 0 10*3/uL (ref 0.0–0.1)
EOS%: 2.7 % (ref 0.0–7.0)
Eosinophils Absolute: 0.3 10*3/uL (ref 0.0–0.5)
HCT: 40 % (ref 34.8–46.6)
HGB: 13.3 g/dL (ref 11.6–15.9)
LYMPH%: 19.5 % (ref 14.0–49.7)
MCH: 29.4 pg (ref 25.1–34.0)
MCHC: 33.3 g/dL (ref 31.5–36.0)
MCV: 88.1 fL (ref 79.5–101.0)
MONO#: 0.9 10*3/uL (ref 0.1–0.9)
MONO%: 9 % (ref 0.0–14.0)
NEUT%: 68.5 % (ref 38.4–76.8)
NEUTROS ABS: 6.9 10*3/uL — AB (ref 1.5–6.5)
PLATELETS: 364 10*3/uL (ref 145–400)
RBC: 4.54 10*6/uL (ref 3.70–5.45)
RDW: 13.3 % (ref 11.2–14.5)
WBC: 10.1 10*3/uL (ref 3.9–10.3)
lymph#: 2 10*3/uL (ref 0.9–3.3)

## 2015-10-20 NOTE — Progress Notes (Signed)
Hematology and Oncology Follow Up Visit  Katrina Huff [id number] 1965-03-07 50 y.o. 10/20/2015 11:37 AM Katrina Huff, MDZanard, Bernadene Bell, MD   Principle Diagnosis: 51 year old woman with leukocytosis noted in December 2016. Her white cell count was 13.6 with a normal differential. The etiology related to reactive process without any clear-cut abnormalities.   Current therapy: Observation and surveillance.  Interim History:  Patient presents today for a follow-up visit. She is a pleasant woman I saw in consultation back in May 2017. Since her last visit, she reports no complaints. She denied any fevers, chills or sweats or dysphagia. She is able to perform activities of daily living without any decline. She reported no lymphadenopathy.   She denies any headaches, blurry vision, syncope or seizures. She does not report any fevers, chills or sweats or weight loss. He does not report any chest pain, palpitation orthopnea. She does not report any cough, wheezing or hemoptysis. She does not report any nausea, vomiting or abdominal pain. She does not report any frequency urgency or hesitancy. She does not report any skeletal complaints of arthralgias or myalgias. Remaining review of systems unremarkable.   Medications: I have reviewed the patient's current medications.  Current Outpatient Prescriptions  Medication Sig Dispense Refill  . ALPRAZolam (XANAX) 0.25 MG tablet Take 0.25 mg by mouth at bedtime as needed for anxiety.    . Ascorbic Acid (VITAMIN C) 1000 MG tablet Take 1,000 mg by mouth daily.    Marland Kitchen aspirin EC 81 MG tablet Take 81 mg by mouth daily.    . Cholecalciferol (VITAMIN D3) 5000 UNITS CAPS Take 1 capsule by mouth.    . levonorgestrel (MIRENA) 20 MCG/24HR IUD 1 each by Intrauterine route once.    . sertraline (ZOLOFT) 100 MG tablet Take 1.5 tablets (150 mg total) by mouth daily. (Patient taking differently: Take 200 mg by mouth daily. ) 135 tablet 4   No current  facility-administered medications for this visit.      Allergies:  Allergies  Allergen Reactions  . Septra [Sulfamethoxazole-Trimethoprim] Rash    Mild rash  . Cephalosporins Rash    Past Medical History, Surgical history, Social history, and Family History were reviewed and updated.   Physical Exam: Blood pressure 104/65, pulse (!) 57, temperature 98.3 F (36.8 C), temperature source Oral, resp. rate 17, height 5\' 4"  (1.626 m), weight 224 lb 11.2 oz (101.9 kg), SpO2 98 %. ECOG: 0 General appearance: alert and cooperative Head: Normocephalic, without obvious abnormality Neck: no adenopathy Lymph nodes: Cervical, supraclavicular, and axillary nodes normal. Heart:regular rate and rhythm, S1, S2 normal, no murmur, click, rub or gallop Lung:chest clear, no wheezing, rales, normal symmetric air entry, Heart exam - S1, S2 normal, no murmur, no gallop, rate regular Abdomin: soft, non-tender, without masses or organomegaly EXT:no erythema, induration, or nodules   Lab Results: Lab Results  Component Value Date   WBC 10.1 10/20/2015   HGB 13.3 10/20/2015   HCT 40.0 10/20/2015   MCV 88.1 10/20/2015   PLT 364 10/20/2015     Chemistry      Component Value Date/Time   NA 141 03/27/2015 1142   K 4.4 03/27/2015 1142   CL 104 03/27/2015 1142   CO2 24 03/27/2015 1142   BUN 14 03/27/2015 1142   CREATININE 0.75 03/27/2015 1142   CREATININE 0.72 03/17/2014 1213      Component Value Date/Time   CALCIUM 9.9 03/27/2015 1142   ALKPHOS 71 03/27/2015 1142   AST 30 03/27/2015 1142   ALT  34 03/27/2015 1142   BILITOT 0.5 03/27/2015 1142       Impression and Plan:  50 year old woman with the following issues:  1. Leukocytosis documented on 2 separate occasions between December 2016 and April 2017. Her white cell count of range between 13,000 and then to 11,000. She had normal differential otherwise. Other CBCs noted in January 2016 and February 2017 all within normal range.  Her  CBC from today was within normal range. Her white cell count was 10.1 with a normal hemoglobin and platelet count.  These findings rule out a primary hematological condition in myeloproliferative disorders are extremely unlikely. Her leukocytosis in the pastIs related to reactive process which have resolved at this time.  No further hematological investigation is needed at this time and I'm happy to see her in the future as needed.  2. Age-appropriate cancer screening: I recommended she continue follow-up with her primary care physician regarding these issues.    [facility], MD 8/29/201711:37 AM

## 2015-11-03 ENCOUNTER — Encounter: Payer: Self-pay | Admitting: Gynecology

## 2015-11-03 ENCOUNTER — Ambulatory Visit (INDEPENDENT_AMBULATORY_CARE_PROVIDER_SITE_OTHER): Payer: BLUE CROSS/BLUE SHIELD | Admitting: Gynecology

## 2015-11-03 VITALS — BP 118/76

## 2015-11-03 DIAGNOSIS — A499 Bacterial infection, unspecified: Secondary | ICD-10-CM

## 2015-11-03 DIAGNOSIS — N760 Acute vaginitis: Secondary | ICD-10-CM | POA: Diagnosis not present

## 2015-11-03 DIAGNOSIS — Z23 Encounter for immunization: Secondary | ICD-10-CM

## 2015-11-03 DIAGNOSIS — B9689 Other specified bacterial agents as the cause of diseases classified elsewhere: Secondary | ICD-10-CM

## 2015-11-03 LAB — WET PREP FOR TRICH, YEAST, CLUE
TRICH WET PREP: NONE SEEN
Yeast Wet Prep HPF POC: NONE SEEN

## 2015-11-03 MED ORDER — TINIDAZOLE 500 MG PO TABS: 2.0000 g | ORAL_TABLET | Freq: Every day | ORAL | 1 refills | Status: DC

## 2015-11-03 NOTE — Addendum Note (Signed)
Addended by: Nelva Nay on: 11/03/2015 02:15 PM   Modules accepted: Orders

## 2015-11-03 NOTE — Progress Notes (Signed)
    Abriya [facility] Aug 29, 1965 [id number]        50 y.o.  [id number] presents with one-day history of vaginal discharge and itching. No odor. No urinary symptoms such as frequency dysuria or urgency low back pains.  Past medical history,surgical history, problem list, medications, allergies, family history and social history were all reviewed and documented in the EPIC chart.  Directed ROS with pertinent positives and negatives documented in the history of present illness/assessment and plan.  Exam: Caryn Bee assistant Vitals:   11/03/15 1141  BP: 118/76   General appearance:  Normal Abdomen soft nontender without masses guarding rebound Pelvic external BUS vagina with white discharge. Cervix grossly normal. IUD string visualized. Uterus grossly normal size midline mobile nontender. Adnexa without masses or tenderness.  Assessment/Plan:  50 y.o. [id number] with above history and exam. Wet prep is consistent with bacterial vaginosis. Treatment options reviewed. Patient elects for Tindamax 4 pills one day 4 pills next day. She did ask by to give her a refill in case she has recurrence and I did this. Follow up if symptoms persist or become recurrent.    Anastasio Auerbach MD, 12:15 PM 11/03/2015

## 2015-11-03 NOTE — Patient Instructions (Signed)
Take the Tindamax 4 pills one day and 4 pills the next day. Avoid alcohol while taking.  Follow up if your symptoms persist or become recurrent.

## 2015-11-03 NOTE — Addendum Note (Signed)
Addended by: Nelva Nay on: 11/03/2015 12:25 PM   Modules accepted: Orders

## 2015-12-30 ENCOUNTER — Telehealth: Payer: Self-pay | Admitting: *Deleted

## 2015-12-30 NOTE — Telephone Encounter (Signed)
would prefer her to call if she would need refill so it would give me an idea as far as how often she is having recurrence.

## 2015-12-30 NOTE — Telephone Encounter (Signed)
Pt called asking for refill on tindamax 500 mg c/o same as noted in office visit , was prescribed on OV 11/03/15. I told pt she had a refill on Rx due to recurrence, she had forgot about this and is going to get refill today. Pt asked if another Rx for tindamax could be sent in again for the further if this should occur again? Please advise

## 2015-12-30 NOTE — Telephone Encounter (Signed)
Pt informed with the below. 

## 2016-01-06 ENCOUNTER — Telehealth: Payer: Self-pay | Admitting: *Deleted

## 2016-01-06 NOTE — Telephone Encounter (Signed)
Okay for one refill to have available

## 2016-01-06 NOTE — Telephone Encounter (Signed)
This is follow up from telephone encounter on 12/30/15 regarding tindamax 500 mg refill. Pt said she does not have infection now, but is going to be with her partner again this weekend and nervous that BV infection is going to recur again. She would like to have one on file at the pharmacy. I did re-read you response on 12/30/15 telephone encounter. Please advise

## 2016-01-07 MED ORDER — TINIDAZOLE 500 MG PO TABS: 2.0000 g | ORAL_TABLET | Freq: Every day | ORAL | 0 refills | Status: DC

## 2016-01-07 NOTE — Telephone Encounter (Signed)
Pt aware, Rx sent. 

## 2016-02-05 ENCOUNTER — Encounter: Payer: Self-pay | Admitting: Gynecology

## 2016-04-13 ENCOUNTER — Ambulatory Visit (INDEPENDENT_AMBULATORY_CARE_PROVIDER_SITE_OTHER): Payer: BLUE CROSS/BLUE SHIELD | Admitting: Gynecology

## 2016-04-13 ENCOUNTER — Encounter: Payer: Self-pay | Admitting: Gynecology

## 2016-04-13 VITALS — BP 120/78 | Ht 64.0 in | Wt 220.0 lb

## 2016-04-13 DIAGNOSIS — Z01419 Encounter for gynecological examination (general) (routine) without abnormal findings: Secondary | ICD-10-CM | POA: Diagnosis not present

## 2016-04-13 DIAGNOSIS — Z13220 Encounter for screening for lipoid disorders: Secondary | ICD-10-CM | POA: Diagnosis not present

## 2016-04-13 DIAGNOSIS — Z1151 Encounter for screening for human papillomavirus (HPV): Secondary | ICD-10-CM | POA: Diagnosis not present

## 2016-04-13 DIAGNOSIS — N951 Menopausal and female climacteric states: Secondary | ICD-10-CM

## 2016-04-13 DIAGNOSIS — Z30431 Encounter for routine checking of intrauterine contraceptive device: Secondary | ICD-10-CM

## 2016-04-13 LAB — CBC WITH DIFFERENTIAL/PLATELET
BASOS PCT: 0 %
Basophils Absolute: 0 cells/uL (ref 0–200)
Eosinophils Absolute: 210 cells/uL (ref 15–500)
Eosinophils Relative: 2 %
HEMATOCRIT: 39.2 % (ref 35.0–45.0)
Hemoglobin: 13.1 g/dL (ref 11.7–15.5)
Lymphocytes Relative: 21 %
Lymphs Abs: 2205 cells/uL (ref 850–3900)
MCH: 29.4 pg (ref 27.0–33.0)
MCHC: 33.4 g/dL (ref 32.0–36.0)
MCV: 88.1 fL (ref 80.0–100.0)
MONO ABS: 735 {cells}/uL (ref 200–950)
MONOS PCT: 7 %
MPV: 10.4 fL (ref 7.5–12.5)
Neutro Abs: 7350 cells/uL (ref 1500–7800)
Neutrophils Relative %: 70 %
PLATELETS: 428 10*3/uL — AB (ref 140–400)
RBC: 4.45 MIL/uL (ref 3.80–5.10)
RDW: 14 % (ref 11.0–15.0)
WBC: 10.5 10*3/uL (ref 3.8–10.8)

## 2016-04-13 LAB — TSH: TSH: 0.99 mIU/L

## 2016-04-13 NOTE — Addendum Note (Signed)
Addended by: Nelva Nay on: 04/13/2016 03:07 PM   Modules accepted: Orders

## 2016-04-13 NOTE — Patient Instructions (Signed)

## 2016-04-13 NOTE — Progress Notes (Signed)
    Katrina Huff 04-22-1965 [id number]        51 y.o.  G3P3003 for annual exam.    Past medical history,surgical history, problem list, medications, allergies, family history and social history were all reviewed and documented as reviewed in the EPIC chart.  ROS:  Performed with pertinent positives and negatives included in the history, assessment and plan.   Additional significant findings :  None   Exam: Caryn Bee assistant Vitals:   04/13/16 1400  BP: 120/78  Weight: 220 lb (99.8 kg)  Height: 5\' 4"  (1.626 m)   Body mass index is 37.76 kg/m.  General appearance:  Normal affect, orientation and appearance. Skin: Grossly normal HEENT: Without gross lesions.  No cervical or supraclavicular adenopathy. Thyroid normal.  Lungs:  Clear without wheezing, rales or rhonchi Cardiac: RR, without RMG Abdominal:  Soft, nontender, without masses, guarding, rebound, organomegaly or hernia Breasts:  Examined lying and sitting without masses, retractions, discharge or axillary adenopathy. Pelvic:  Ext, BUS, Vagina normal  Cervix normal. IUD string visualized. Pap smear/HPV  Uterus anteverted, normal size, shape and contour, midline and mobile nontender   Adnexa without masses or tenderness    Anus and perineum normal   Rectovaginal normal sphincter tone without palpated masses or tenderness.    Assessment/Plan:  51 y.o. G31P3003 female for annual exam, without menses, Mirena IUD.   1. Mirena IUD 01/2015. Doing well without menses. IUD string visualized. 2. Menopausal symptoms. Patient does note feeling blue at times with some emotional swings. Not overly bothersome but wondered whether it was related to menopausal changes. Not having significant hot flushes or night sweats. We'll check baseline FSH and TSH. Will monitor for now. 3. Mammography 01/2016. Continue with annual mammography when due. SBE monthly reviewed. 4. Pap smear/HPV 2014. Pap smear/HPV done today. History of LGSIL 2009  with normal Pap smears since. 5. Health maintenance. Patient requested baseline labs. CBC, CMP, lipid profile, TSH, FSH and urinalysis ordered. Follow up in one year, sooner as needed.   Anastasio Auerbach MD, 2:35 PM 04/13/2016

## 2016-04-14 ENCOUNTER — Other Ambulatory Visit: Payer: Self-pay | Admitting: Gynecology

## 2016-04-14 DIAGNOSIS — E782 Mixed hyperlipidemia: Secondary | ICD-10-CM

## 2016-04-14 DIAGNOSIS — R7309 Other abnormal glucose: Secondary | ICD-10-CM

## 2016-04-14 LAB — URINALYSIS W MICROSCOPIC + REFLEX CULTURE
BACTERIA UA: NONE SEEN [HPF]
BILIRUBIN URINE: NEGATIVE
Casts: NONE SEEN [LPF]
Crystals: NONE SEEN [HPF]
GLUCOSE, UA: NEGATIVE
HGB URINE DIPSTICK: NEGATIVE
KETONES UR: NEGATIVE
LEUKOCYTES UA: NEGATIVE
Nitrite: NEGATIVE
PROTEIN: NEGATIVE
RBC / HPF: NONE SEEN RBC/HPF (ref <=2)
Specific Gravity, Urine: 1.017 (ref 1.001–1.035)
WBC UA: NONE SEEN WBC/HPF (ref <=5)
Yeast: NONE SEEN [HPF]
pH: 6.5 (ref 5.0–8.0)

## 2016-04-14 LAB — PAP IG AND HPV HIGH-RISK: HPV DNA High Risk: NOT DETECTED

## 2016-04-14 LAB — COMPREHENSIVE METABOLIC PANEL
ALT: 19 U/L (ref 6–29)
AST: 21 U/L (ref 10–35)
Albumin: 4.1 g/dL (ref 3.6–5.1)
Alkaline Phosphatase: 71 U/L (ref 33–115)
BILIRUBIN TOTAL: 0.5 mg/dL (ref 0.2–1.2)
BUN: 12 mg/dL (ref 7–25)
CO2: 21 mmol/L (ref 20–31)
Calcium: 9.1 mg/dL (ref 8.6–10.2)
Chloride: 104 mmol/L (ref 98–110)
Creat: 0.74 mg/dL (ref 0.50–1.10)
GLUCOSE: 108 mg/dL — AB (ref 65–99)
Potassium: 4.4 mmol/L (ref 3.5–5.3)
SODIUM: 138 mmol/L (ref 135–146)
Total Protein: 7.1 g/dL (ref 6.1–8.1)

## 2016-04-14 LAB — LIPID PANEL
Cholesterol: 231 mg/dL — ABNORMAL HIGH (ref <200)
HDL: 45 mg/dL — ABNORMAL LOW (ref >50)
LDL Cholesterol: 139 mg/dL — ABNORMAL HIGH (ref <100)
Total CHOL/HDL Ratio: 5.1 Ratio — ABNORMAL HIGH (ref <5.0)
Triglycerides: 234 mg/dL — ABNORMAL HIGH (ref <150)
VLDL: 47 mg/dL — ABNORMAL HIGH (ref <30)

## 2016-04-14 LAB — FOLLICLE STIMULATING HORMONE: FSH: 7.6 m[IU]/mL

## 2016-04-15 ENCOUNTER — Other Ambulatory Visit: Payer: BLUE CROSS/BLUE SHIELD

## 2016-04-15 DIAGNOSIS — R7309 Other abnormal glucose: Secondary | ICD-10-CM

## 2016-04-15 DIAGNOSIS — E782 Mixed hyperlipidemia: Secondary | ICD-10-CM

## 2016-04-15 LAB — HEMOGLOBIN A1C
HEMOGLOBIN A1C: 5.2 % (ref <5.7)
MEAN PLASMA GLUCOSE: 103 mg/dL

## 2016-04-15 LAB — LIPID PANEL
Cholesterol: 248 mg/dL — ABNORMAL HIGH (ref <200)
HDL: 50 mg/dL — ABNORMAL LOW (ref >50)
LDL CALC: 173 mg/dL — AB (ref <100)
Total CHOL/HDL Ratio: 5 Ratio — ABNORMAL HIGH (ref <5.0)
Triglycerides: 123 mg/dL (ref <150)
VLDL: 25 mg/dL (ref <30)

## 2016-04-16 LAB — GLUCOSE, FASTING: GLUCOSE, FASTING: 90 mg/dL (ref 65–99)

## 2016-04-18 ENCOUNTER — Encounter: Payer: Self-pay | Admitting: Gynecology

## 2016-07-13 ENCOUNTER — Encounter: Payer: Self-pay | Admitting: Women's Health

## 2016-07-13 ENCOUNTER — Ambulatory Visit (INDEPENDENT_AMBULATORY_CARE_PROVIDER_SITE_OTHER): Payer: BLUE CROSS/BLUE SHIELD | Admitting: Women's Health

## 2016-07-13 VITALS — BP 130/82 | Ht 64.0 in | Wt 220.0 lb

## 2016-07-13 DIAGNOSIS — L739 Follicular disorder, unspecified: Secondary | ICD-10-CM

## 2016-07-13 NOTE — Patient Instructions (Signed)
Folliculitis Folliculitis is inflammation of the hair follicles. Folliculitis most commonly occurs on the scalp, thighs, legs, back, and buttocks. However, it can occur anywhere on the body. What are the causes? This condition may be caused by:  A bacterial infection (common).  A fungal infection.  A viral infection.  Coming into contact with certain chemicals, especially oils and tars.  Shaving or waxing.  Applying greasy ointments or creams to your skin often. Long-lasting folliculitis and folliculitis that keeps coming back can be caused by bacteria that live in the nostrils. What increases the risk? This condition is more likely to develop in people with:  A weakened immune system.  Diabetes.  Obesity. What are the signs or symptoms? Symptoms of this condition include:  Redness.  Soreness.  Swelling.  Itching.  Small white or yellow, pus-filled, itchy spots (pustules) that appear over a reddened area. If there is an infection that goes deep into the follicle, these may develop into a boil (furuncle).  A group of closely packed boils (carbuncle). These tend to form in hairy, sweaty areas of the body. How is this diagnosed? This condition is diagnosed with a skin exam. To find what is causing the condition, your health care provider may take a sample of one of the pustules or boils for testing. How is this treated? This condition may be treated by:  Applying warm compresses to the affected areas.  Taking an antibiotic medicine or applying an antibiotic medicine to the skin.  Applying or bathing with an antiseptic solution.  Taking an over-the-counter medicine to help with itching.  Having a procedure to drain any pustules or boils. This may be done if a pustule or boil contains a lot of pus or fluid.  Laser hair removal. This may be done to treat long-lasting folliculitis. Follow these instructions at home:  If directed, apply heat to the affected area as  often as told by your health care provider. Use the heat source that your health care provider recommends, such as a moist heat pack or a heating pad.  Place a towel between your skin and the heat source.  Leave the heat on for 20-30 minutes.  Remove the heat if your skin turns bright red. This is especially important if you are unable to feel pain, heat, or cold. You may have a greater risk of getting burned.  If you were prescribed an antibiotic medicine, use it as told by your health care provider. Do not stop using the antibiotic even if you start to feel better.  Take over-the-counter and prescription medicines only as told by your health care provider.  Do not shave irritated skin.  Keep all follow-up visits as told by your health care provider. This is important. Get help right away if:  You have more redness, swelling, or pain in the affected area.  Red streaks are spreading from the affected area.  You have a fever. This information is not intended to replace advice given to you by your health care provider. Make sure you discuss any questions you have with your health care provider. Document Released: 04/18/2001 Document Revised: 08/28/2015 Document Reviewed: 11/28/2014 Elsevier Interactive Patient Education  2017 [employer]  

## 2016-07-13 NOTE — Progress Notes (Signed)
Presents with complaint of sore swollen area on left labia that started yesterday and became  extremely painful today. States had to take a pain pill due to discomfort. Denies vaginal discharge, urinary symptoms, abdominal pain or fever. Denies any change in routine. 01/2015 Mirena IUD with no bleeding.  Exam: Appears uncomfortable, external genitalia on left labia minora 2 cm fluctuated erythematous area tender to touch. Area cleansed with Betadine, 18-gauge needle copious serous drainage, good relief of discomfort, Neosporin applied.  Folliculitis  Plan: Keep clean and dry, Neosporin twice daily, avoid tight clothing, open to air as able. Instructed to call if further problems.

## 2017-02-08 ENCOUNTER — Encounter: Payer: Self-pay | Admitting: Gynecology

## 2017-07-12 ENCOUNTER — Encounter: Payer: Self-pay | Admitting: Women's Health

## 2017-07-12 ENCOUNTER — Ambulatory Visit: Payer: BLUE CROSS/BLUE SHIELD | Admitting: Women's Health

## 2017-07-12 VITALS — BP 118/78

## 2017-07-12 DIAGNOSIS — N898 Other specified noninflammatory disorders of vagina: Secondary | ICD-10-CM

## 2017-07-12 DIAGNOSIS — N912 Amenorrhea, unspecified: Secondary | ICD-10-CM | POA: Diagnosis not present

## 2017-07-12 DIAGNOSIS — R350 Frequency of micturition: Secondary | ICD-10-CM

## 2017-07-12 DIAGNOSIS — B9689 Other specified bacterial agents as the cause of diseases classified elsewhere: Secondary | ICD-10-CM

## 2017-07-12 DIAGNOSIS — N760 Acute vaginitis: Secondary | ICD-10-CM

## 2017-07-12 LAB — WET PREP FOR TRICH, YEAST, CLUE

## 2017-07-12 MED ORDER — FLUCONAZOLE 150 MG PO TABS: 150.0000 mg | ORAL_TABLET | Freq: Once | ORAL | 1 refills | Status: AC

## 2017-07-12 MED ORDER — METRONIDAZOLE 500 MG PO TABS: 500.0000 mg | ORAL_TABLET | Freq: Two times a day (BID) | ORAL | 0 refills | Status: DC

## 2017-07-12 NOTE — Patient Instructions (Signed)

## 2017-07-12 NOTE — Progress Notes (Addendum)
52 year old S WF G3, P3 presents with complaint of urinary odor, with vaginal irritation for the past week.  Denies vaginal itching, abdominal pain or fever.  Slight burning with urination.  Rare bleeding with Mirena IUD placed 01/2015..  Questions of inflamed hair follicle on left labia.  Same partner, denies need for STD screen.  History of anxiety and depression,.  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  Questioning if menopausal.  Exam: Appears well.  No CVAT.  Abdomen soft, obese, nontender, external genitalia within normal limits, small folliculitis on left labia majora, speculum exam scant discharge, IUD strings visible, wet prep positive for clues, TNTC bacteria.  Bimanual no CMT or tenderness with exam. UA: Negative leukocytes, negative nitrites, no WBCs, no RBCs, moderate bacteria.  Bacterial vaginosis  Plan: Flagyl 500 twice daily for 7 days, alcohol precautions reviewed.  Instructed to call if continued problems.  Encourage warm soaks to folliculitis, Neosporin, loose clothing, change immediately after exercise.  [place] pending.

## 2017-07-14 LAB — URINALYSIS, COMPLETE W/RFL CULTURE
BILIRUBIN URINE: NEGATIVE
GLUCOSE, UA: NEGATIVE
Hyaline Cast: NONE SEEN /LPF
Ketones, ur: NEGATIVE
LEUKOCYTE ESTERASE: NEGATIVE
NITRITES URINE, INITIAL: NEGATIVE
PH: 6 (ref 5.0–8.0)
Protein, ur: NEGATIVE
RBC / HPF: NONE SEEN /HPF (ref 0–2)
SPECIFIC GRAVITY, URINE: 1.025 (ref 1.001–1.03)
WBC UA: NONE SEEN /HPF (ref 0–5)

## 2017-07-14 LAB — CULTURE INDICATED

## 2017-07-14 LAB — URINE CULTURE
MICRO NUMBER:: 90627238
SPECIMEN QUALITY: ADEQUATE

## 2017-08-29 ENCOUNTER — Encounter: Payer: BLUE CROSS/BLUE SHIELD | Admitting: Gynecology

## 2017-09-27 ENCOUNTER — Encounter: Payer: BLUE CROSS/BLUE SHIELD | Admitting: Gynecology

## 2017-10-12 ENCOUNTER — Encounter: Payer: BLUE CROSS/BLUE SHIELD | Admitting: Gynecology

## 2017-10-31 ENCOUNTER — Ambulatory Visit: Payer: BLUE CROSS/BLUE SHIELD | Admitting: Gynecology

## 2017-10-31 ENCOUNTER — Encounter: Payer: Self-pay | Admitting: Gynecology

## 2017-10-31 VITALS — BP 122/86 | Ht 63.25 in | Wt 230.0 lb

## 2017-10-31 DIAGNOSIS — N951 Menopausal and female climacteric states: Secondary | ICD-10-CM

## 2017-10-31 DIAGNOSIS — Z23 Encounter for immunization: Secondary | ICD-10-CM | POA: Diagnosis not present

## 2017-10-31 DIAGNOSIS — R3915 Urgency of urination: Secondary | ICD-10-CM

## 2017-10-31 DIAGNOSIS — Z01419 Encounter for gynecological examination (general) (routine) without abnormal findings: Secondary | ICD-10-CM | POA: Diagnosis not present

## 2017-10-31 DIAGNOSIS — Z30431 Encounter for routine checking of intrauterine contraceptive device: Secondary | ICD-10-CM

## 2017-10-31 NOTE — Progress Notes (Signed)
    Katrina Huff 02/05/1966 [id number]        52 y.o.  [id number] for annual gynecologic exam.  Patient notes that she is starting to have hot flushes and sweats.  Has Mirena IUD with no menses.  Past medical history,surgical history, problem list, medications, allergies, family history and social history were all reviewed and documented as reviewed in the EPIC chart.  ROS:  Performed with pertinent positives and negatives included in the history, assessment and plan.   Additional significant findings : None   Exam: Copywriter, advertising Vitals:   10/31/17 1530  BP: 122/86  Weight: 230 lb (104.3 kg)  Height: 5' 3.25" (1.607 m)   Body mass index is 40.42 kg/m.  General appearance:  Normal affect, orientation and appearance. Skin: Grossly normal HEENT: Without gross lesions.  No cervical or supraclavicular adenopathy. Thyroid normal.  Lungs:  Clear without wheezing, rales or rhonchi Cardiac: RR, without RMG Abdominal:  Soft, nontender, without masses, guarding, rebound, organomegaly or hernia Breasts:  Examined lying and sitting without masses, retractions, discharge or axillary adenopathy. Pelvic:  Ext, BUS, Vagina: Normal  Cervix: Normal.  IUD string visualized at external loss  Uterus: Anteverted, normal size, shape and contour, midline and mobile nontender   Adnexa: Without masses or tenderness    Anus and perineum: Normal   Rectovaginal: Normal sphincter tone without palpated masses or tenderness.    Assessment/Plan:  52 y.o. G35P3003 female for annual gynecologic exam without menses, Mirena IUD.   1. Mirena IUD 01/2015.  Doing well without menses.  IUD string visualized. 2. Menopausal symptoms.  Starting to have more hot flushes and sweats.  Asked if I could check her hormone level.  [place] drawn.  We discussed the perimenopause and what to expect.  Options for management to include OTC products as well is HRT discussed.  Risks versus benefits with HRT reviewed to include the WHI  study.  At this point the patient is going to try OTC products and follow-up if her symptoms worsen and she wants to rediscuss HRT.  I did review with her the risks to include thrombosis of breast cancer issue as well as benefits to include symptom relief cardiovascular and bone health.  Having a Mirena would afford endometrial protection and we would require estrogen only recognizing this is an off brand label indication. 3. Urinary incontinence with some urgency.  Patient having classic stress incontinence symptoms to include loss of urine with coughing sneezing laughing.  Having a little bit of urgency with this.  Check baseline urine analysis.  I discussed stress incontinence with her and the options to include behavior modification, contribution from weight to symptomatology, Kegel exercise and lastly surgery. 4. Mammography coming due in December.  Breast exam normal today. 5. Pap smear/HPV 2018.  No Pap smear done today.  History of LGSIL 2009 with normal Pap smears since. 6. Colonoscopy 2018. 7. Health maintenance.  Has routine lab work done elsewhere.  Follow-up in 1 year, sooner as needed.   Anastasio Auerbach MD, 4:26 PM 10/31/2017

## 2017-10-31 NOTE — Patient Instructions (Signed)
Follow-up in 1 year, sooner if any issues 

## 2017-11-01 ENCOUNTER — Encounter: Payer: Self-pay | Admitting: Gynecology

## 2017-11-01 LAB — FOLLICLE STIMULATING HORMONE: FSH: 3.8 m[IU]/mL

## 2017-11-02 LAB — URINALYSIS, COMPLETE W/RFL CULTURE
BACTERIA UA: NONE SEEN /HPF
Bilirubin Urine: NEGATIVE
Casts: NONE SEEN /LPF
Crystals: NONE SEEN /HPF
Glucose, UA: NEGATIVE
Hgb urine dipstick: NEGATIVE
Ketones, ur: NEGATIVE
Nitrites, Initial: NEGATIVE
PH: 5 (ref 5.0–8.0)
PROTEIN: NEGATIVE
RBC / HPF: NONE SEEN /HPF (ref 0–2)
SPECIFIC GRAVITY, URINE: 1.03 (ref 1.001–1.03)

## 2017-11-02 LAB — URINE CULTURE
MICRO NUMBER:: 91087357
SPECIMEN QUALITY:: ADEQUATE

## 2017-11-02 LAB — CULTURE INDICATED

## 2018-03-06 ENCOUNTER — Encounter: Payer: Self-pay | Admitting: Gynecology

## 2018-11-02 ENCOUNTER — Encounter: Payer: BLUE CROSS/BLUE SHIELD | Admitting: Gynecology

## 2018-11-14 ENCOUNTER — Encounter: Payer: Self-pay | Admitting: Gynecology

## 2019-05-27 ENCOUNTER — Other Ambulatory Visit: Payer: Self-pay

## 2019-05-27 ENCOUNTER — Ambulatory Visit: Payer: BLUE CROSS/BLUE SHIELD | Admitting: Women's Health

## 2019-05-27 ENCOUNTER — Encounter: Payer: Self-pay | Admitting: Women's Health

## 2019-05-27 VITALS — BP 126/80

## 2019-05-27 DIAGNOSIS — N951 Menopausal and female climacteric states: Secondary | ICD-10-CM

## 2019-05-27 MED ORDER — ESTRADIOL 0.05 MG/24HR TD PTTW: 1.0000 | MEDICATED_PATCH | TRANSDERMAL | 6 refills | Status: DC

## 2019-05-27 NOTE — Progress Notes (Signed)
54 year old D WF G3, P3 presents with numerous complaints hot flashes, fatigue, difficulty with sleep, nausea, and generally feeling poor.  History of depression, [facility] but reports has not been as helpful as she she had hoped.  Has had problems with depression for many years.  States is able to sleep from 11-7 daily but would like to sleep longer and states is wide-awake at 7.  Meds with mood center currently on Zoloft 150 mg daily.  Father recently died in [place] which she was able to get there prior to his death.  Has 3 children none lives at home 60 year old son in a wilderness program in [place].  01/2015 Mirena IUD amenorrhea.  Not sexually active.  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  No other medical problems.  Denies feelings of self-harm.  Exam: Well-groomed, flat affect,  Menopausal symptoms Depression  Plan: Options for menopause discussed, will try Vivelle 0.05 patch twice weekly, proper use, risks of blood clots, strokes and breast cancer discussed and accepts.  Instructed to call if this does not help hot flushes, reviewed may help with rest and mood.  Reviewed 8 hours of sleep from 11-7 is good, reassured normal amount of sleep.  Strongly encouraged you schedule follow-up ASAP the [facility] center for counseling and/or possible med changes.  Encouraged self-care, leisure activities, meditation and exercise.  Instructed to follow-up with the ER if any feelings of self-harm surface.  Mirena IUD due to be removed in December and reviewed progesterone would need to be added.

## 2019-05-27 NOTE — Patient Instructions (Signed)

## 2019-06-16 ENCOUNTER — Other Ambulatory Visit: Payer: Self-pay | Admitting: Women's Health

## 2020-08-01 ENCOUNTER — Other Ambulatory Visit: Payer: Self-pay

## 2020-08-01 ENCOUNTER — Emergency Department (HOSPITAL_COMMUNITY)
Admission: EM | Admit: 2020-08-01 | Discharge: 2020-08-02 | Disposition: A | Discharge status: Home / Self Care | Payer: BLUE CROSS/BLUE SHIELD | Attending: Emergency Medicine | Admitting: Emergency Medicine

## 2020-08-01 ENCOUNTER — Encounter (HOSPITAL_COMMUNITY): Payer: Self-pay | Admitting: Emergency Medicine

## 2020-08-01 DIAGNOSIS — N23 Unspecified renal colic: Secondary | ICD-10-CM

## 2020-08-01 DIAGNOSIS — R1031 Right lower quadrant pain: Secondary | ICD-10-CM | POA: Diagnosis present

## 2020-08-01 DIAGNOSIS — Z85828 Personal history of other malignant neoplasm of skin: Secondary | ICD-10-CM | POA: Insufficient documentation

## 2020-08-01 DIAGNOSIS — N132 Hydronephrosis with renal and ureteral calculous obstruction: Secondary | ICD-10-CM | POA: Insufficient documentation

## 2020-08-01 DIAGNOSIS — R911 Solitary pulmonary nodule: Secondary | ICD-10-CM | POA: Diagnosis not present

## 2020-08-01 LAB — CBC WITH DIFFERENTIAL/PLATELET
Abs Immature Granulocytes: 0.04 10*3/uL (ref 0.00–0.07)
Basophils Absolute: 0 10*3/uL (ref 0.0–0.1)
Basophils Relative: 0 %
Eosinophils Absolute: 0.2 10*3/uL (ref 0.0–0.5)
Eosinophils Relative: 1 %
HCT: 42.5 % (ref 36.0–46.0)
Hemoglobin: 14.3 g/dL (ref 12.0–15.0)
Immature Granulocytes: 0 %
Lymphocytes Relative: 19 %
Lymphs Abs: 2.8 10*3/uL (ref 0.7–4.0)
MCH: 31.1 pg (ref 26.0–34.0)
MCHC: 33.6 g/dL (ref 30.0–36.0)
MCV: 92.4 fL (ref 80.0–100.0)
Monocytes Absolute: 1.7 10*3/uL — ABNORMAL HIGH (ref 0.1–1.0)
Monocytes Relative: 12 %
Neutro Abs: 9.9 10*3/uL — ABNORMAL HIGH (ref 1.7–7.7)
Neutrophils Relative %: 68 %
Platelets: 397 10*3/uL (ref 150–400)
RBC: 4.6 MIL/uL (ref 3.87–5.11)
RDW: 12.7 % (ref 11.5–15.5)
WBC: 14.7 10*3/uL — ABNORMAL HIGH (ref 4.0–10.5)
nRBC: 0 % (ref 0.0–0.2)

## 2020-08-01 MED ORDER — OXYCODONE-ACETAMINOPHEN 5-325 MG PO TABS
1.0000 | ORAL_TABLET | Freq: Once | ORAL | Status: AC
  Administered 2020-08-01: 1 via ORAL
  Filled 2020-08-01: qty 1

## 2020-08-01 MED ORDER — ONDANSETRON 4 MG PO TBDP
4.0000 mg | ORAL_TABLET | Freq: Once | ORAL | Status: AC
  Administered 2020-08-01: 4 mg via ORAL
  Filled 2020-08-01: qty 1

## 2020-08-01 MED ORDER — HYDROCODONE-ACETAMINOPHEN 5-325 MG PO TABS: 1.0000 | ORAL_TABLET | Freq: Once | ORAL | Status: DC

## 2020-08-01 NOTE — ED Triage Notes (Signed)
Pt arrive POV with c/o right lower abd pain that started today.

## 2020-08-01 NOTE — ED Provider Notes (Signed)
Emergency Medicine Provider Triage Evaluation Note  Katrina Huff , a 55 y.o. female  was evaluated in triage.  Pt complains of abd pain. Located to right side abdomen, upper and lower. Pain began earlier today. Denies chance of pregnancy, has IUD. No urinary complaints. NBNB emesis just PTA. No hx of kidney stones. No CP, SOB. No diarrhea, fever.  Review of Systems  Positive: Right abd pain Negative: CP, SOB  Physical Exam  BP (!) 175/96 (BP Location: Right Arm)   Pulse 67   Temp 97.9 F (36.6 C) (Oral)   Resp 18   SpO2 99%  Gen:   Awake, no distress   Resp:  Normal effort  ABD:  Diffuse abd pain to right, difficult exam secondary to patient  MSK:   Moves extremities without difficulty  Other:    Medical Decision Making  Medically screening exam initiated at 11:21 PM.  Appropriate orders placed.  Katrina Huff was informed that the remainder of the evaluation will be completed by another provider, this initial triage assessment does not replace that evaluation, and the importance of remaining in the ED until their evaluation is complete.  Right sided abd pain, emesis  Labs, imaging ordered, Stable   Katrina Huff A, PA-C 08/01/20 2321    Katrina Saver, MD 08/02/20 1711

## 2020-08-02 ENCOUNTER — Emergency Department (HOSPITAL_COMMUNITY): Payer: BLUE CROSS/BLUE SHIELD

## 2020-08-02 LAB — COMPREHENSIVE METABOLIC PANEL
ALT: 17 U/L (ref 0–44)
AST: 21 U/L (ref 15–41)
Albumin: 4 g/dL (ref 3.5–5.0)
Alkaline Phosphatase: 63 U/L (ref 38–126)
Anion gap: 9 (ref 5–15)
BUN: 16 mg/dL (ref 6–20)
CO2: 26 mmol/L (ref 22–32)
Calcium: 9.5 mg/dL (ref 8.9–10.3)
Chloride: 103 mmol/L (ref 98–111)
Creatinine, Ser: 1.17 mg/dL — ABNORMAL HIGH (ref 0.44–1.00)
GFR, Estimated: 56 mL/min — ABNORMAL LOW (ref >60)
Glucose, Bld: 91 mg/dL (ref 70–99)
Potassium: 4 mmol/L (ref 3.5–5.1)
Sodium: 138 mmol/L (ref 135–145)
Total Bilirubin: 0.4 mg/dL (ref 0.3–1.2)
Total Protein: 7.1 g/dL (ref 6.5–8.1)

## 2020-08-02 LAB — URINALYSIS, ROUTINE W REFLEX MICROSCOPIC
Bilirubin Urine: NEGATIVE
Glucose, UA: NEGATIVE mg/dL
Ketones, ur: NEGATIVE mg/dL
Nitrite: NEGATIVE
Protein, ur: NEGATIVE mg/dL
Specific Gravity, Urine: >1.03 — ABNORMAL HIGH (ref 1.005–1.030)
pH: 6 (ref 5.0–8.0)

## 2020-08-02 LAB — URINALYSIS, MICROSCOPIC (REFLEX)

## 2020-08-02 LAB — LIPASE, BLOOD: Lipase: 29 U/L (ref 11–51)

## 2020-08-02 LAB — I-STAT BETA HCG BLOOD, ED (MC, WL, AP ONLY): I-stat hCG, quantitative: <5 m[IU]/mL (ref <5)

## 2020-08-02 MED ORDER — KETOROLAC TROMETHAMINE 60 MG/2ML IM SOLN
30.0000 mg | Freq: Once | INTRAMUSCULAR | Status: AC
  Administered 2020-08-02: 30 mg via INTRAMUSCULAR
  Filled 2020-08-02: qty 2

## 2020-08-02 MED ORDER — NAPROXEN 375 MG PO TABS: 375.0000 mg | ORAL_TABLET | Freq: Two times a day (BID) | ORAL | 0 refills | Status: AC

## 2020-08-02 MED ORDER — FENTANYL CITRATE (PF) 100 MCG/2ML IJ SOLN
50.0000 ug | INTRAMUSCULAR | Status: DC | PRN
  Administered 2020-08-02: 50 ug via INTRAVENOUS
  Filled 2020-08-02: qty 2

## 2020-08-02 MED ORDER — ONDANSETRON HCL 4 MG/2ML IJ SOLN
4.0000 mg | Freq: Once | INTRAMUSCULAR | Status: AC
  Administered 2020-08-02: 4 mg via INTRAVENOUS
  Filled 2020-08-02: qty 2

## 2020-08-02 MED ORDER — ONDANSETRON 4 MG PO TBDP: 4.0000 mg | ORAL_TABLET | Freq: Three times a day (TID) | ORAL | 0 refills | Status: AC | PRN

## 2020-08-02 MED ORDER — OXYCODONE HCL 5 MG PO TABS: 2.5000 mg | ORAL_TABLET | Freq: Four times a day (QID) | ORAL | 0 refills | Status: AC | PRN

## 2020-08-02 NOTE — Discharge Instructions (Addendum)
Follow up with your PCP regarding the incidental findings on your CT scan   Contact a health care provider if: You have a fever or chills. Your urine smells bad or looks cloudy. You have pain or burning when you pass urine. Get help right away if: Your flank pain or groin pain suddenly worsens. You become confused or disoriented or you lose consciousness.

## 2020-08-02 NOTE — ED Provider Notes (Signed)
[facility] EMERGENCY DEPARTMENT Provider Note   CSN: [id number] Arrival date & time: 08/01/20  2310     History Chief Complaint  Patient presents with   Abdominal Pain    Katrina Huff is a 55 y.o. female.  The history is provided by the patient and medical records. No language interpreter was used.  Abdominal Pain Pain location:  RLQ Pain quality: aching, heavy and pressure   Pain radiates to:  R flank Pain severity:  Severe Onset quality:  Sudden Duration:  12 hours Timing:  Constant Progression:  Resolved Chronicity:  Recurrent Context: awakening from sleep and retching   Context: not alcohol use, not diet changes, not eating, not laxative use, not medication withdrawal, not previous surgeries, not recent illness, not recent sexual activity, not recent travel, not sick contacts, not suspicious food intake and not trauma   Relieved by: opiate pain meds. Worsened by:  Nothing Associated symptoms: nausea and vomiting   Associated symptoms: no anorexia, no belching, no chest pain, no chills, no constipation, no cough, no diarrhea, no dysuria, no fatigue, no fever, no flatus, no hematemesis, no hematochezia, no hematuria, no melena, no shortness of breath, no sore throat, no vaginal bleeding and no vaginal discharge   Risk factors: no alcohol abuse, has not had multiple surgeries and not pregnant       Past Medical History:  Diagnosis Date   Anxiety    Basal cell carcinoma    Depression    Disc disorder    L4,L5   LGSIL (low grade squamous intraepithelial dysplasia) 10/2007    Patient Active Problem List   Diagnosis Date Noted   Left facial numbness 10/29/2013   Screening examination for pulmonary tuberculosis 02/08/2013   Leukocytosis 09/09/2012   Obesity, unspecified 09/09/2012   Rash and nonspecific skin eruption 09/09/2012   Unspecified vitamin D deficiency 09/09/2012   Other and unspecified hyperlipidemia 09/09/2012   Anxiety 03/01/2012    Disc disorder    LGSIL (low grade squamous intraepithelial dysplasia)    Herpes labialis 12/09/2010   Chest pain 10/19/2010    Past Surgical History:  Procedure Laterality Date   basal cell cancer excised     COLPOSCOPY     MAXIMUM ACCESS (MAS)POSTERIOR LUMBAR INTERBODY FUSION (PLIF) 1 LEVEL N/A 12/04/2012   Procedure: Lumbar four-five Maximum Access Surgery Posterior Lumbar Interbody Fusion;  Surgeon: Eustace Moore, MD;  Location: [facility];  Service: Neurosurgery;  Laterality: N/A;  Lumbar four-five Maximum Access Surgery Posterior Lumbar Interbody Fusion   Mirena     Inserted 01/22/2015     OB History     Gravida  3   Para  3   Term  3   Preterm      AB      Living  3      SAB      IAB      Ectopic      Multiple      Live Births              Family History  Problem Relation Age of Onset   Diabetes Father    Cancer Father        Carcinoid   Breast cancer Mother        Age 40   Breast cancer Maternal Grandmother        Onset postmenopausal   Coronary artery disease Maternal Grandmother     Social History   Tobacco Use   Smoking status:  Never   Smokeless tobacco: Never  Vaping Use   Vaping Use: Never used  Substance Use Topics   Alcohol use: Yes    Alcohol/week: 0.0 standard drinks    Comment: occasional   Drug use: Yes    Types: Marijuana    Home Medications Prior to Admission medications   Medication Sig Start Date End Date Taking? Authorizing Provider  Cholecalciferol (VITAMIN D3) 5000 UNITS CAPS Take 1 capsule by mouth.    [provider]  estradiol (VIVELLE-DOT) 0.05 MG/24HR patch PLACE 1 PATCH (0.05 MG TOTAL) ONTO THE SKIN 2 (TWO) TIMES A WEEK. 06/17/19   Huel Cote, NP  levonorgestrel (MIRENA) 20 MCG/24HR IUD 1 each by Intrauterine route once.    [provider]  sertraline (ZOLOFT) 100 MG tablet Take 1.5 tablets (150 mg total) by mouth daily. Patient taking differently: Take 200 mg by mouth daily.   03/17/14   Fontaine, Belinda Block, MD    Allergies    Septra [sulfamethoxazole-trimethoprim] and Cephalosporins  Review of Systems   Review of Systems  Constitutional:  Negative for chills, fatigue and fever.  HENT:  Negative for sore throat.   Eyes: Negative.   Respiratory:  Negative for cough and shortness of breath.   Cardiovascular:  Negative for chest pain.  Gastrointestinal:  Positive for abdominal pain, nausea and vomiting. Negative for anorexia, constipation, diarrhea, flatus, hematemesis, hematochezia and melena.  Endocrine: Negative.   Genitourinary:  Positive for flank pain. Negative for dysuria, hematuria, vaginal bleeding, vaginal discharge and vaginal pain.  Neurological: Negative.   Psychiatric/Behavioral: Negative.     Physical Exam Updated Vital Signs BP 127/67   Pulse (!) 54   Temp 98.7 F (37.1 C) (Oral)   Resp 14   SpO2 99%   Physical Exam Vitals and nursing note reviewed.  Constitutional:      General: She is not in acute distress.    Appearance: She is well-developed. She is not diaphoretic.     Comments: Ambulates to exam room from [place]:     Head: Normocephalic and atraumatic.     Right Ear: External ear normal.     Left Ear: External ear normal.     Nose: Nose normal.     Mouth/Throat:     Mouth: Mucous membranes are moist.  Eyes:     General: No scleral icterus.    Conjunctiva/sclera: Conjunctivae normal.  Cardiovascular:     Rate and Rhythm: Normal rate and regular rhythm.     Heart sounds: Normal heart sounds. No murmur heard.   No friction rub. No gallop.  Pulmonary:     Effort: Pulmonary effort is normal. No respiratory distress.     Breath sounds: Normal breath sounds.  Abdominal:     General: Bowel sounds are normal. There is no distension.     Palpations: Abdomen is soft. There is no mass.     Tenderness: There is abdominal tenderness in the right lower quadrant. There is guarding. There is no right CVA tenderness or left CVA  tenderness.  Musculoskeletal:     Cervical back: Normal range of motion.  Skin:    General: Skin is warm and dry.  Neurological:     Mental Status: She is alert and oriented to person, place, and time.  Psychiatric:        Behavior: Behavior normal.    ED Results / Procedures / Treatments   Labs (all labs ordered are listed, but only abnormal results are displayed) Labs  Reviewed  CBC WITH DIFFERENTIAL/PLATELET - Abnormal; Notable for the following components:      Result Value   WBC 14.7 (*)    Neutro Abs 9.9 (*)    Monocytes Absolute 1.7 (*)    All other components within normal limits  COMPREHENSIVE METABOLIC PANEL - Abnormal; Notable for the following components:   Creatinine, Ser 1.17 (*)    GFR, Estimated 56 (*)    All other components within normal limits  URINALYSIS, ROUTINE W REFLEX MICROSCOPIC - Abnormal; Notable for the following components:   Specific Gravity, Urine >1.030 (*)    Hgb urine dipstick LARGE (*)    Leukocytes,Ua TRACE (*)    All other components within normal limits  URINALYSIS, MICROSCOPIC (REFLEX) - Abnormal; Notable for the following components:   Bacteria, UA RARE (*)    All other components within normal limits  LIPASE, BLOOD  I-STAT BETA HCG BLOOD, ED ([facility], [facility], [facility] ONLY)    EKG None  Radiology No results found.  Procedures Procedures   Medications Ordered in ED Medications  ondansetron (ZOFRAN-ODT) disintegrating tablet 4 mg (4 mg Oral Given 08/01/20 2329)  oxyCODONE-acetaminophen (PERCOCET/ROXICET) 5-325 MG per tablet 1 tablet (1 tablet Oral Given 08/01/20 2329)    ED Course  I have reviewed the triage vital signs and the nursing notes.  Pertinent labs & imaging results that were available during my care of the patient were reviewed by me and considered in my medical decision making (see chart for details).    MDM Rules/Calculators/A&P                          55 year old G24, P8 female who presents emergency department with chief  complaint of right lower quadrant abdominal pain.  2 nights ago she states that she was awoken out of her sleep at 3:00 in the morning with aches cruciated and right lower quadrant pain, severe pressure in her groin and pain in her right flank which she states felt "just like labor pain."  She states that the best way to I can describe it is like when you are about to give birth.  She denies urinary symptoms or vaginal symptoms.  She states that she was up for several hours, took a leftover Norco that she had from her previous back surgery and was able to get to sleep.  She awoke at 3 PM yesterday and states she was fine up until around 8:30 PM when she had intense pressure in her groin again and the pain returned severely.  She had multiple episodes of vomiting.  She is never had anything like this before.  She has been unfortunately in the emergency department waiting room for almost 9 hours.  She was given pain medications and currently her pain is resolved.  She denies a history of ovarian cysts.  She denies a history of kidney stones.  She has no previous abdominal surgeries.  She denies fever or chills. Differential diagnosis of her lower abdominal considerations include,  pelvic inflammatory disease, ectopic pregnancy, appendicitis, urinary calculi, primary dysmenorrhea, septic abortion, ruptured ovarian cyst or tumor, ovarian torsion, tubo-ovarian abscess, degeneration of fibroid, endometriosis, diverticulitis, cystitis.  I reviewed labs ordered at triage which show elevated white blood cell count, negative pregnancy test, lipase within normal limits, CMP with mildly elevated creatinine at 1.17 representing AKI.  Urine shows large amount of hemoglobin.CT Renal ordered.  Given the large differential diagnosis for [facility], the decision making in  this case is of high complexity.  I personally reviewed the patient's images which show obstructing stone Right UVJ, incidentaloma of lung and breast-  recent mammogram in may ,negative. Patient aware of all findings and asked to follow up with pcp.. After evaluating all of the data points in this case, the presentation of Berklie Dethlefs is diagnosed with kidney stone.  The presentation NOT consistent with an infected stone, nephric abscess, sepsis, or renal failure.  Similarly, this presentation is NOT consistent with AAA; Mesenteric Ischemia; Bowel Perforation; Bowel Obstruction; Sigmoid Volvulus; Diverticulitis; Appendicitis; Peritonitis; Cholecystitis, ascending cholangitis or other gallbladder disease; perforated ulcer; significant GI bleeding, splenic rupture/infarction; Hepatic abscess; or other surgical/acute abdomen.  Similarly, this presentation is NOT consistent with ACS or Myocardial Ischemia; Pulmonary Embolism; fistula; incarcerated hernia; Pancreatitis, Aortic Dissection; Diabetic Ketoacidosis; Ischemic colitis; Psoas or other abscess; Methanol poisoning; Heavy metal toxicity; or porphyria.  Similarly, this case is NOT consistent with Fitz-Hugh-Curtis Syndrome, Ectopic Pregnancy, Placental Abruption, PID, Tubo-ovarian abscess, Ovarian Torsion, or STI.  Similarly, this presentation is NOT consistent with acute coronary syndrome, pulmonary embolism, dissection, borhaave's, arrythmia, pneumothorax, cardiac tamponade, or other emergent cardiopulmonary condition.  Similarly, this presentation is NOT consistent with pyelonephritis, urinary infection, pneumonia, or other focal bacterial infection.  Strict return and follow-up precautions have been given by me personally or by detailed written instruction verbalized by nursing staff using the teach back method. to the patient/family/caregiver(s).  Data Reviewed/Counseling: I have reviwed the patient's vital signs, nursing notes, and other relevant tests/information. I had a detailed discussion regarding the historical points, exam findings, and any diagnostic results supporting the discharge  diagnosis. I also discussed the need for outpatient follow-up and the need to return to the ED if symptoms worsen or if there are any questions or concerns that arise at home.    Final Clinical Impression(s) / ED Diagnoses Final diagnoses:  Ureteral colic  Incidental pulmonary nodule    Rx / DC Orders ED Discharge Orders     None        Margarita Mail, PA-C 08/02/20 1454    Pattricia Boss, MD 08/05/20 1034

## 2020-12-02 ENCOUNTER — Encounter (HOSPITAL_COMMUNITY): Payer: Self-pay | Admitting: Radiology

## 2023-11-22 ENCOUNTER — Other Ambulatory Visit: Payer: Self-pay | Admitting: Medical Genetics

## 2023-12-21 ENCOUNTER — Other Ambulatory Visit

## 2023-12-21 DIAGNOSIS — Z006 Encounter for examination for normal comparison and control in clinical research program: Secondary | ICD-10-CM

## 2023-12-29 LAB — GENECONNECT MOLECULAR SCREEN: Genetic Analysis Overall Interpretation: NEGATIVE
